# Patient Record
Sex: Male | Born: 1980 | Race: White | Hispanic: No | Marital: Single | Smoking: Former smoker
Health system: Southern US, Community
[De-identification: ages and names within clinical notes are randomized; demographics above are authoritative.]

## PROBLEM LIST (undated history)

## (undated) DIAGNOSIS — G4730 Sleep apnea, unspecified: Secondary | ICD-10-CM

## (undated) DIAGNOSIS — F419 Anxiety disorder, unspecified: Secondary | ICD-10-CM

## (undated) DIAGNOSIS — K219 Gastro-esophageal reflux disease without esophagitis: Secondary | ICD-10-CM

## (undated) DIAGNOSIS — F32A Depression, unspecified: Secondary | ICD-10-CM

## (undated) HISTORY — DX: Sleep apnea, unspecified: G47.30

## (undated) HISTORY — DX: Gastro-esophageal reflux disease without esophagitis: K21.9

## (undated) HISTORY — PX: HERNIA REPAIR: SHX51

## (undated) HISTORY — DX: Anxiety disorder, unspecified: F41.9

## (undated) HISTORY — DX: Depression, unspecified: F32.A

---

## 2015-06-25 DIAGNOSIS — K439 Ventral hernia without obstruction or gangrene: Secondary | ICD-10-CM | POA: Insufficient documentation

## 2021-06-05 ENCOUNTER — Ambulatory Visit: Payer: Self-pay | Admitting: Family Medicine

## 2021-06-25 ENCOUNTER — Encounter: Payer: Self-pay | Admitting: Family Medicine

## 2021-06-25 ENCOUNTER — Ambulatory Visit (INDEPENDENT_AMBULATORY_CARE_PROVIDER_SITE_OTHER): Payer: 59 | Admitting: Family Medicine

## 2021-06-25 VITALS — BP 129/69 | HR 54 | Ht 72.0 in | Wt 225.4 lb

## 2021-06-25 DIAGNOSIS — R109 Unspecified abdominal pain: Secondary | ICD-10-CM | POA: Diagnosis not present

## 2021-06-25 DIAGNOSIS — F32A Depression, unspecified: Secondary | ICD-10-CM

## 2021-06-25 DIAGNOSIS — F419 Anxiety disorder, unspecified: Secondary | ICD-10-CM

## 2021-06-25 DIAGNOSIS — R351 Nocturia: Secondary | ICD-10-CM | POA: Diagnosis not present

## 2021-06-25 LAB — COMPREHENSIVE METABOLIC PANEL
ALT: 17 U/L (ref 0–53)
AST: 16 U/L (ref 0–37)
Albumin: 4.4 g/dL (ref 3.5–5.2)
Alkaline Phosphatase: 105 U/L (ref 39–117)
BUN: 11 mg/dL (ref 6–23)
CO2: 28 mEq/L (ref 19–32)
Calcium: 8.9 mg/dL (ref 8.4–10.5)
Chloride: 102 mEq/L (ref 96–112)
Creatinine, Ser: 0.69 mg/dL (ref 0.40–1.50)
GFR: 115.76 mL/min (ref >60.00)
Glucose, Bld: 106 mg/dL — ABNORMAL HIGH (ref 70–99)
Potassium: 4.2 mEq/L (ref 3.5–5.1)
Sodium: 138 mEq/L (ref 135–145)
Total Bilirubin: 0.4 mg/dL (ref 0.2–1.2)
Total Protein: 7 g/dL (ref 6.0–8.3)

## 2021-06-25 LAB — PSA: PSA: 0.34 ng/mL (ref 0.10–4.00)

## 2021-06-25 LAB — CBC
HCT: 39.5 % (ref 39.0–52.0)
Hemoglobin: 13.2 g/dL (ref 13.0–17.0)
MCHC: 33.5 g/dL (ref 30.0–36.0)
MCV: 89.6 fl (ref 78.0–100.0)
Platelets: 290 10*3/uL (ref 150.0–400.0)
RBC: 4.41 Mil/uL (ref 4.22–5.81)
RDW: 14.3 % (ref 11.5–15.5)
WBC: 5.9 10*3/uL (ref 4.0–10.5)
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
## 2021-06-25 NOTE — Progress Notes (Signed)
Surgical referral for hernia repair ?

## 2021-06-25 NOTE — Patient Instructions (Signed)
Thank you for choosing [facility] at [facility] for your Primary Care needs. I am excited for the opportunity to partner with you to meet your health care goals. It was a pleasure meeting you today!  Recommendations from today's visit: Requesting records from previous provider Referrals placed to behavioral health/psych and urology CT scan to see if these are hernias, if so we can refer to general surgery   Information on diet, exercise, and health maintenance recommendations are listed below. This is information to help you be sure you are on track for optimal health and monitoring.   Please look over this and let us know if you have any questions or if you have completed any of the health maintenance outside of [facility] so that we can be sure your records are up to date.  ___________________________________________________________  MyChart:  For all urgent or time sensitive needs we ask that you please call the office to avoid delays. Our number is [phone]. MyChart is not constantly monitored and due to the large volume of messages a day, replies may take up to 72 business hours.  MyChart Policy: MyChart allows for you to see your visit notes, after visit summary, provider recommendations, lab and tests results, make an appointment, request refills, and contact your provider or the office for non-urgent questions or concerns. Providers are seeing patients during normal business hours and do not have built in time to review MyChart messages.  We ask that you allow a minimum of 3 business days for responses to [employer]. For this reason, please do not send urgent requests through MyChart. Please call the office at [phone]. New and ongoing conditions may require a visit. We have virtual and in-person visits available for your convenience.  Complex MyChart concerns may require a visit. Your provider may request you schedule a virtual or in-person visit  to ensure we are providing the best care possible. MyChart messages sent after 11:00 AM on Friday will not be received by the provider until Monday morning.    Lab and Test Results: You will receive your lab and test results on MyChart as soon as they are completed and results have been sent by the lab or testing facility. Due to this service, you will receive your results BEFORE your provider.  I review lab and test results each morning prior to seeing patients. Some results require collaboration with other providers to ensure you are receiving the most appropriate care. For this reason, we ask that you please allow a minimum of 3-5 business days from the time that ALL results have been received for your provider to receive and review lab and test results and contact you about these.  Most lab and test result comments from the provider will be sent through MyChart. Your provider may recommend changes to the plan of care, follow-up visits, repeat testing, ask questions, or request an office visit to discuss these results. You may reply directly to this message or call the office to provide information for the provider or set up an appointment. In some instances, you will be called with test results and recommendations. Please let us know if this is preferred and we will make note of this in your chart to provide this for you.    If you have not heard a response to your lab or test results in 5 business days from all results returning to MyChart, please call the office to let us know. We ask that  you please avoid calling prior to this time unless there is an emergent concern. Due to high call volumes, this can delay the resulting process.  After Hours: For all non-emergency after hours needs, please call the office at [phone] and select the option to reach the on-call  service. On-call services are shared between multiple [facility] offices and therefore it will not be possible to speak directly with  your provider. On-call providers may provide medical advice and recommendations, but are unable to provide refills for maintenance medications.  For all emergency or urgent medical needs after normal business hours, we recommend that you seek care at the closest Urgent Care or Emergency Department to ensure appropriate treatment in a timely manner.  [facility] [place] has a 24 hour emergency room located on the ground floor for your convenience.   Urgent Concerns During the Business Day Providers are seeing patients from 8AM to 5PM with a busy schedule and are most often not able to respond to non-urgent calls until the end of the day or the next business day. If you should have URGENT concerns during the day, please call and speak to the nurse or schedule a same day appointment so that we can address your concern without delay.   Thank you, again, for choosing me as your health care partner. I appreciate your trust and look forward to learning more about you.   Lollie Marrow Reola Calkins, DNP, FNP-C  ___________________________________________________________  Health Maintenance Recommendations Screening Testing Mammogram Every 1-2 years based on history and risk factors Starting at age 3 Pap Smear Ages 21-39 every 3 years Ages 29-65 every 5 years with HPV testing More frequent testing may be required based on results and history Colon Cancer Screening Every 1-10 years based on test performed, risk factors, and history Starting at age 51 Bone Density Screening Every 2-10 years based on history Starting at age 110 for women Recommendations for men differ based on medication usage, history, and risk factors AAA Screening One time ultrasound Men 49-28 years old who have ever smoked Lung Cancer Screening Low Dose Lung CT every 12 months Age 78-80 years with a 20 pack-year smoking history who still smoke or who have quit within the last 15 years  Screening Labs Routine  Labs:  Complete Blood Count (CBC), Complete Metabolic Panel (CMP), Cholesterol (Lipid Panel) Every 6-12 months based on history and medications May be recommended more frequently based on current conditions or previous results Hemoglobin A1c Lab Every 3-12 months based on history and previous results Starting at age 42 or earlier with diagnosis of diabetes, high cholesterol, BMI >26, and/or risk factors Frequent monitoring for patients with diabetes to ensure blood sugar control Thyroid Panel (TSH w/ T3 & T4) Every 6 months based on history, symptoms, and risk factors May be repeated more often if on medication HIV One time testing for all patients 77 and older May be repeated more frequently for patients with increased risk factors or exposure Hepatitis C One time testing for all patients 28 and older May be repeated more frequently for patients with increased risk factors or exposure Gonorrhea, Chlamydia Every 12 months for all sexually active persons 13-24 years Additional monitoring may be recommended for those who are considered high risk or who have symptoms PSA Men 65-80 years old with risk factors Additional screening may be recommended from age 19-69 based on risk factors, symptoms, and history  Vaccine Recommendations Tetanus Booster All adults every 10 years Flu Vaccine All patients  6 months and older every year COVID Vaccine All patients 12 years and older Initial dosing with booster May recommend additional booster based on age and health history HPV Vaccine 2 doses all patients age 71-26 Dosing may be considered for patients over 26 Shingles Vaccine (Shingrix) 2 doses all adults 50 years and older Pneumonia (Pneumovax 23) All adults 65 years and older May recommend earlier dosing based on health history Pneumonia (Prevnar 30) All adults 65 years and older Dosed 1 year after Pneumovax 23 Pneumonia (Prevnar 20) All adults 65 years and older (adults 19-64 with certain  conditions or risk factors) 1 dose  For those who have no received Prevnar 13 vaccine previously   Additional Screening, Testing, and Vaccinations may be recommended on an individualized basis based on family history, health history, risk factors, and/or exposure.  __________________________________________________________  Diet Recommendations for All Patients  I recommend that all patients maintain a diet low in saturated fats, carbohydrates, and cholesterol. While this can be challenging at first, it is not impossible and small changes can make big differences.  Things to try: Decreasing the amount of soda, sweet tea, and/or juice to one or less per day and replace with water While water is always the first choice, if you do not like water you may consider adding a water additive without sugar to improve the taste other sugar free drinks Replace potatoes with a brightly colored vegetable at dinner Use healthy oils, such as canola oil or olive oil, instead of butter or hard margarine Limit your bread intake to two pieces or less a day Replace regular pasta with low carb pasta options Bake, broil, or grill foods instead of frying Monitor portion sizes  Eat smaller, more frequent meals throughout the day instead of large meals  An important thing to remember is, if you love foods that are not great for your health, you don't have to give them up completely. Instead, allow these foods to be a reward when you have done well. Allowing yourself to still have special treats every once in a while is a nice way to tell yourself thank you for working hard to keep yourself healthy.   Also remember that every day is a new day. If you have a bad day and "fall off the wagon", you can still climb right back up and keep moving along on your journey!  We have resources available to help you!  Some websites that may be helpful  include: www.http://www.wall-moore.info/  Www.VeryWellFit.com _____________________________________________________________  Activity Recommendations for All Patients  I recommend that all adults get at least 20 minutes of moderate physical activity that elevates your heart rate at least 5 days out of the week.  Some examples include: Walking or jogging at a pace that allows you to carry on a conversation Cycling (stationary bike or outdoors) Water aerobics Yoga Weight lifting Dancing If physical limitations prevent you from putting stress on your joints, exercise in a pool or seated in a chair are excellent options.  Do determine your MAXIMUM heart rate for activity: YOUR AGE - 220 = MAX HeartRate   Remember! Do not push yourself too hard.  Start slowly and build up your pace, speed, weight, time in exercise, etc.  Allow your body to rest between exercise and get good sleep. You will need more water than normal when you are exerting yourself. Do not wait until you are thirsty to drink. Drink with a purpose of getting in at least 8, 8 ounce glasses  of water a day plus more depending on how much you exercise and sweat.    If you begin to develop dizziness, chest pain, abdominal pain, jaw pain, shortness of breath, headache, vision changes, lightheadedness, or other concerning symptoms, stop the activity and allow your body to rest. If your symptoms are severe, seek emergency evaluation immediately. If your symptoms are concerning, but not severe, please let us know so that we can recommend further evaluation.

## 2021-07-04 ENCOUNTER — Telehealth (HOSPITAL_BASED_OUTPATIENT_CLINIC_OR_DEPARTMENT_OTHER): Payer: Self-pay

## 2021-07-12 ENCOUNTER — Telehealth (HOSPITAL_BASED_OUTPATIENT_CLINIC_OR_DEPARTMENT_OTHER): Payer: Self-pay

## 2021-07-18 ENCOUNTER — Ambulatory Visit: Payer: 59 | Admitting: Urology

## 2021-08-06 ENCOUNTER — Encounter: Payer: Self-pay | Admitting: Urology

## 2021-08-06 ENCOUNTER — Ambulatory Visit: Payer: 59 | Admitting: Urology

## 2021-08-30 ENCOUNTER — Ambulatory Visit: Payer: 59 | Admitting: Urology

## 2021-09-02 ENCOUNTER — Inpatient Hospital Stay (HOSPITAL_BASED_OUTPATIENT_CLINIC_OR_DEPARTMENT_OTHER)
Admission: EM | Admit: 2021-09-02 | Discharge: 2021-09-07 | DRG: 060 | Disposition: A | Discharge status: Home / Self Care | Payer: 59 | Attending: Family Medicine | Admitting: Family Medicine

## 2021-09-02 ENCOUNTER — Emergency Department (HOSPITAL_BASED_OUTPATIENT_CLINIC_OR_DEPARTMENT_OTHER): Payer: 59

## 2021-09-02 ENCOUNTER — Encounter (HOSPITAL_BASED_OUTPATIENT_CLINIC_OR_DEPARTMENT_OTHER): Payer: Self-pay | Admitting: Emergency Medicine

## 2021-09-02 ENCOUNTER — Emergency Department (HOSPITAL_COMMUNITY): Payer: 59

## 2021-09-02 ENCOUNTER — Other Ambulatory Visit: Payer: Self-pay

## 2021-09-02 DIAGNOSIS — R202 Paresthesia of skin: Secondary | ICD-10-CM

## 2021-09-02 DIAGNOSIS — F909 Attention-deficit hyperactivity disorder, unspecified type: Secondary | ICD-10-CM | POA: Diagnosis present

## 2021-09-02 DIAGNOSIS — K5909 Other constipation: Secondary | ICD-10-CM | POA: Diagnosis present

## 2021-09-02 DIAGNOSIS — G379 Demyelinating disease of central nervous system, unspecified: Secondary | ICD-10-CM | POA: Diagnosis not present

## 2021-09-02 DIAGNOSIS — F32A Depression, unspecified: Secondary | ICD-10-CM | POA: Diagnosis present

## 2021-09-02 DIAGNOSIS — Z79899 Other long term (current) drug therapy: Secondary | ICD-10-CM

## 2021-09-02 DIAGNOSIS — R946 Abnormal results of thyroid function studies: Secondary | ICD-10-CM | POA: Diagnosis present

## 2021-09-02 DIAGNOSIS — K219 Gastro-esophageal reflux disease without esophagitis: Secondary | ICD-10-CM | POA: Diagnosis present

## 2021-09-02 DIAGNOSIS — F410 Panic disorder [episodic paroxysmal anxiety] without agoraphobia: Secondary | ICD-10-CM | POA: Diagnosis present

## 2021-09-02 DIAGNOSIS — Z818 Family history of other mental and behavioral disorders: Secondary | ICD-10-CM

## 2021-09-02 DIAGNOSIS — R200 Anesthesia of skin: Secondary | ICD-10-CM

## 2021-09-02 DIAGNOSIS — F419 Anxiety disorder, unspecified: Secondary | ICD-10-CM | POA: Diagnosis present

## 2021-09-02 DIAGNOSIS — R350 Frequency of micturition: Secondary | ICD-10-CM | POA: Diagnosis present

## 2021-09-02 DIAGNOSIS — G35 Multiple sclerosis: Secondary | ICD-10-CM | POA: Diagnosis not present

## 2021-09-02 DIAGNOSIS — Z87891 Personal history of nicotine dependence: Secondary | ICD-10-CM

## 2021-09-02 DIAGNOSIS — G4733 Obstructive sleep apnea (adult) (pediatric): Secondary | ICD-10-CM | POA: Diagnosis present

## 2021-09-02 DIAGNOSIS — R7989 Other specified abnormal findings of blood chemistry: Secondary | ICD-10-CM

## 2021-09-02 LAB — BASIC METABOLIC PANEL
Anion gap: 6 (ref 5–15)
BUN: 12 mg/dL (ref 6–20)
CO2: 24 mmol/L (ref 22–32)
Calcium: 8.9 mg/dL (ref 8.9–10.3)
Chloride: 107 mmol/L (ref 98–111)
Creatinine, Ser: 0.76 mg/dL (ref 0.61–1.24)
GFR, Estimated: >60 mL/min (ref >60)
Glucose, Bld: 120 mg/dL — ABNORMAL HIGH (ref 70–99)
Potassium: 3.5 mmol/L (ref 3.5–5.1)
Sodium: 137 mmol/L (ref 135–145)

## 2021-09-02 LAB — CBC
HCT: 42.4 % (ref 39.0–52.0)
Hemoglobin: 14.5 g/dL (ref 13.0–17.0)
MCH: 30.2 pg (ref 26.0–34.0)
MCHC: 34.2 g/dL (ref 30.0–36.0)
MCV: 88.3 fL (ref 80.0–100.0)
Platelets: 293 10*3/uL (ref 150–400)
RBC: 4.8 MIL/uL (ref 4.22–5.81)
RDW: 13.2 % (ref 11.5–15.5)
WBC: 7.3 10*3/uL (ref 4.0–10.5)
nRBC: 0 % (ref 0.0–0.2)

## 2021-09-02 LAB — CBG MONITORING, ED: Glucose-Capillary: 169 mg/dL — ABNORMAL HIGH (ref 70–99)

## 2021-09-02 IMAGING — MR MR HEAD W/O CM
10 of 11 series · 43 of 48 positions shown · non-contrast
Comparison: CT from earlier the same day.

CLINICAL DATA: Initial evaluation for neuro deficit, stroke
suspected.

EXAM:
MRI HEAD WITHOUT CONTRAST
TECHNIQUE: Multiplanar, multiecho pulse sequences of the brain and surrounding
structures were obtained without intravenous contrast.

[Series 5: DWI · axial · 3.0mm · 0.88mm/px · z∈[-111,+36]mm · 10 of 100 slices shown (1 of 4)]
[im 1/100]
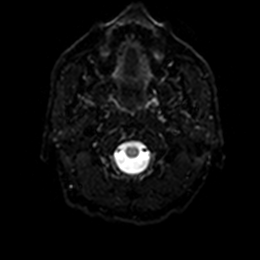
[im 12/100]
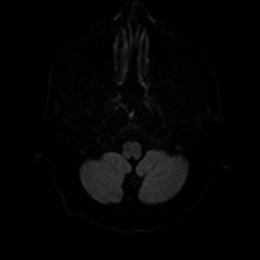
[im 23/100]
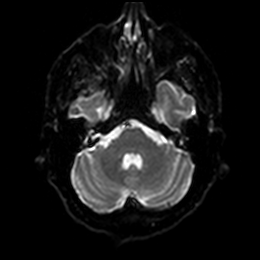
[im 34/100]
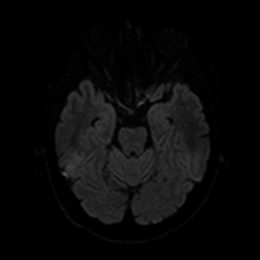
[im 45/100]
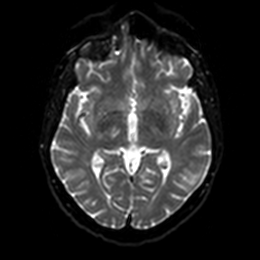
[im 56/100]
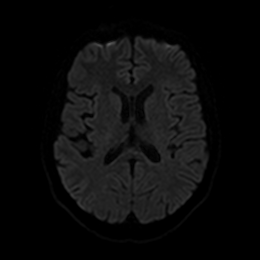
[im 67/100]
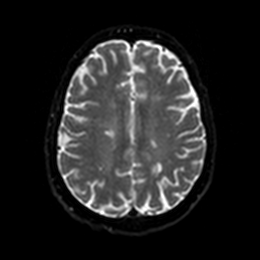
[im 78/100]
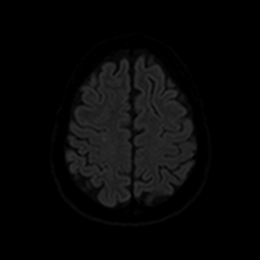
[im 89/100]
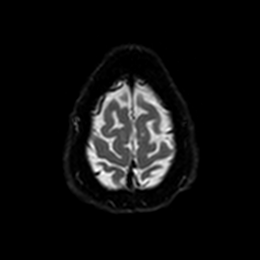
[im 100/100]
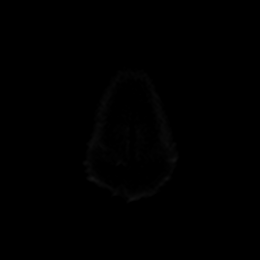

[Series 6: DWI · axial · 3.0mm · 0.88mm/px · z∈[-111,+36]mm · 5 of 49 slices shown (2 of 4)]
[im 1/49]
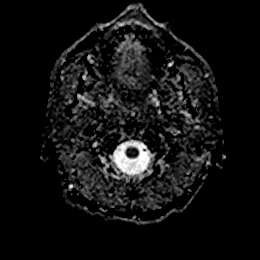
[im 13/49]
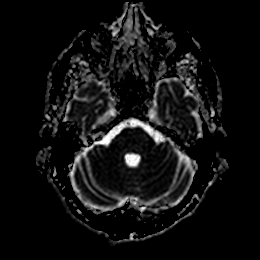
[im 25/49]
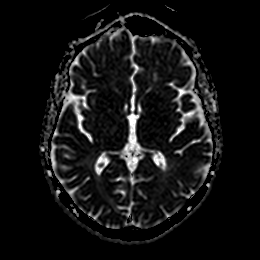
[im 37/49]
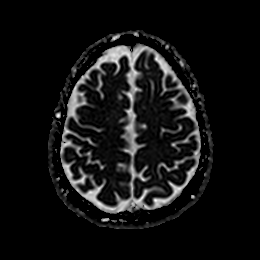
[im 49/49]
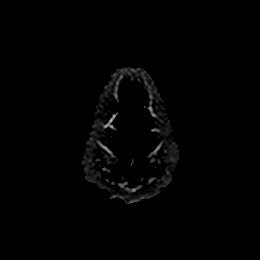

[Series 7: DWI · coronal · 4.0mm · 0.88mm/px · 6 of 64 slices shown (3 of 4)]
[im 1/64]
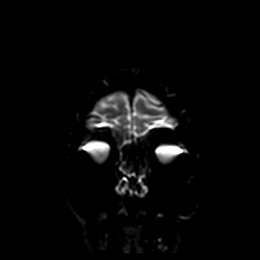
[im 13/64]
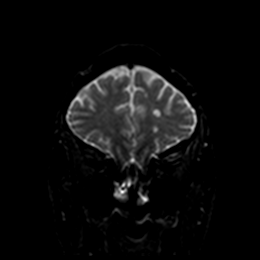
[im 26/64]
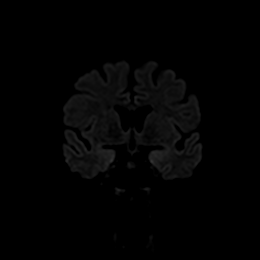
[im 38/64]
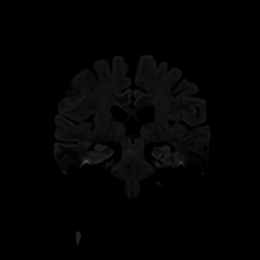
[im 51/64]
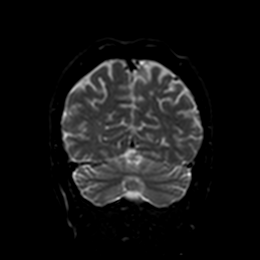
[im 64/64]
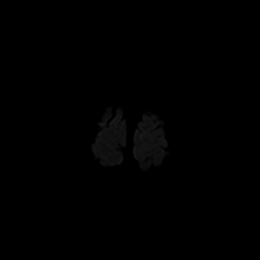

[Series 8: DWI · coronal · 4.0mm · 0.88mm/px · 3 of 32 slices shown (4 of 4)]
[im 1/32]
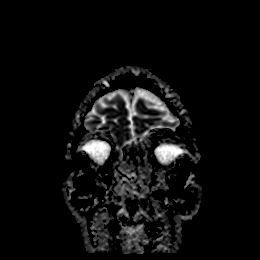
[im 16/32]
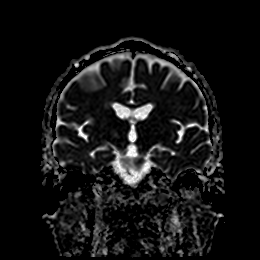
[im 32/32]
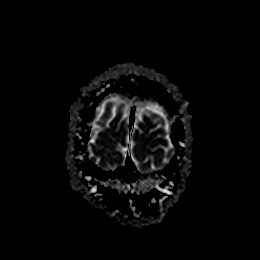

[Series 9: T1 · sagittal · 5.0mm · 0.75mm/px · 2 of 23 slices shown]
[im 1/23]
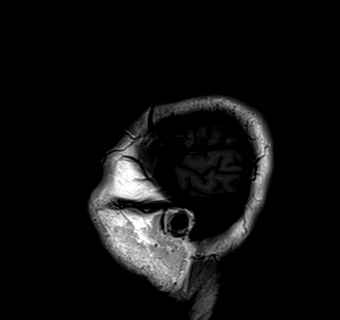
[im 23/23]
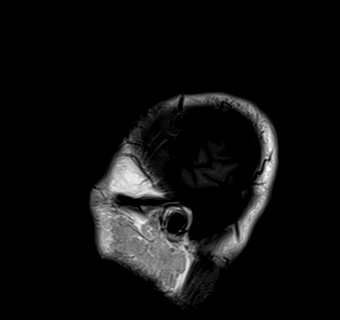

[Series 10: T2 · axial · 5.0mm · 0.72mm/px · z∈[-115,+40]mm · 2 of 27 slices shown (1 of 2)]
[im 1/27]
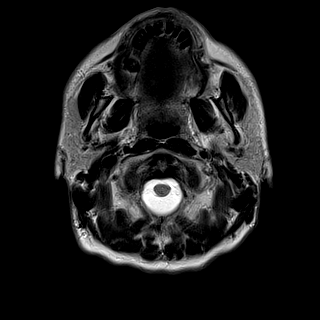
[im 27/27]
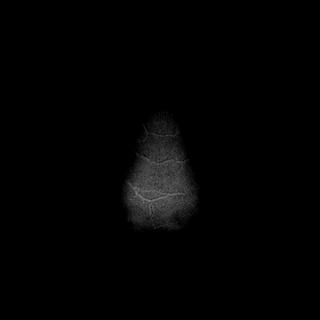

[Series 11: FLAIR · axial · 5.0mm · 0.45mm/px · z∈[-116,+40]mm · 2 of 27 slices shown]
[im 1/27]
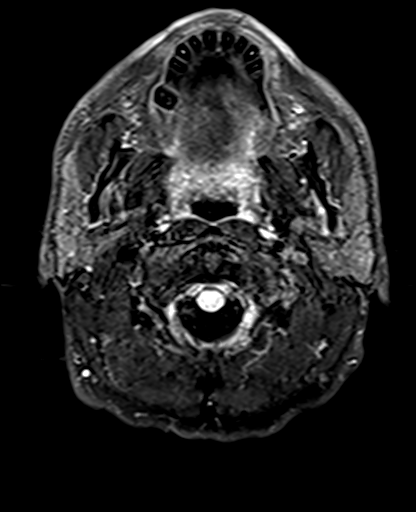
[im 27/27]
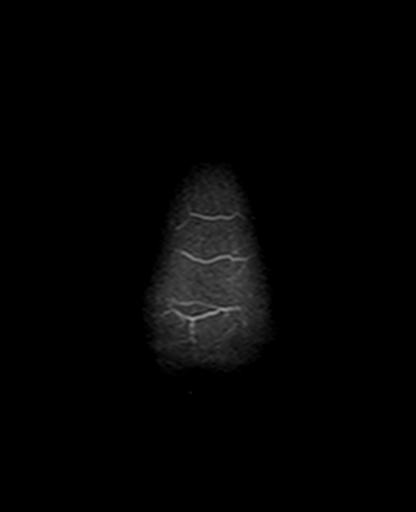

[Series 13: pha_images · axial · 3.0mm · 0.90mm/px · z∈[-123,+50]mm · 5 of 59 slices shown]
[im 1/59]
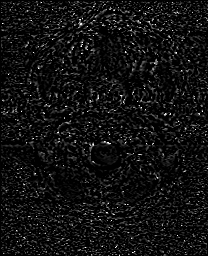
[im 15/59]
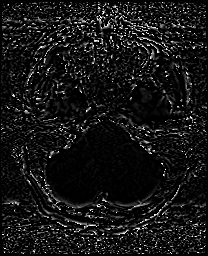
[im 30/59]
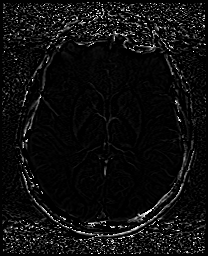
[im 44/59]
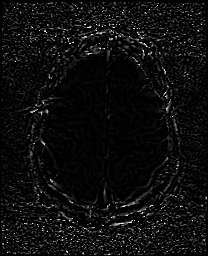
[im 59/59]
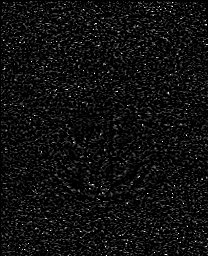

[Series 14: swi_images · axial · 3.0mm · 0.90mm/px · z∈[-126,+50]mm · 5 of 60 slices shown]
[im 1/60]
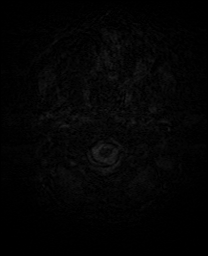
[im 15/60]
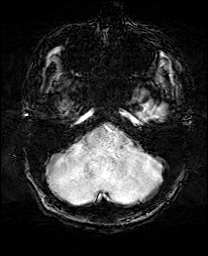
[im 30/60]
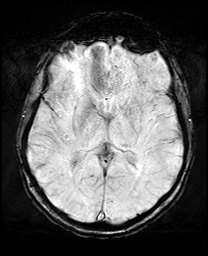
[im 45/60]
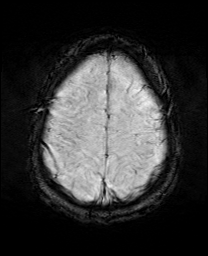
[im 60/60]
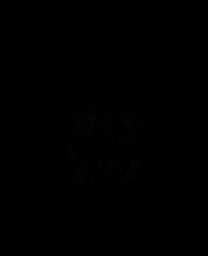

[Series 17: T2 · coronal · 5.0mm · 0.34mm/px · 3 of 29 slices shown (2 of 2)]
[im 1/29]
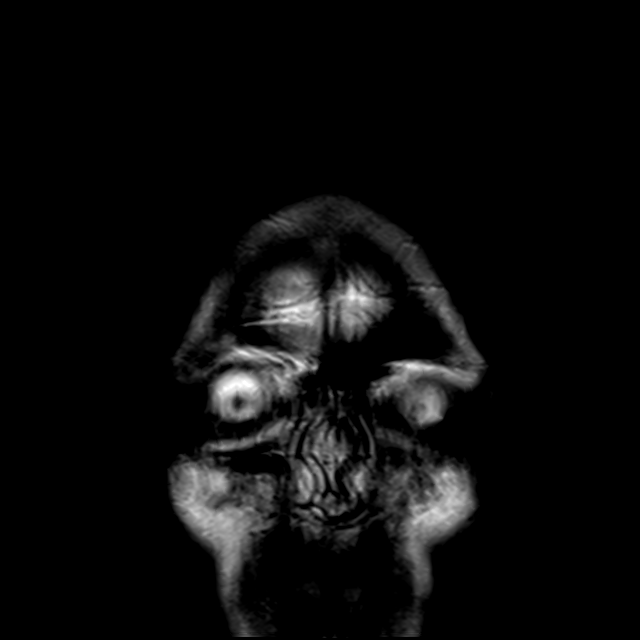
[im 15/29]
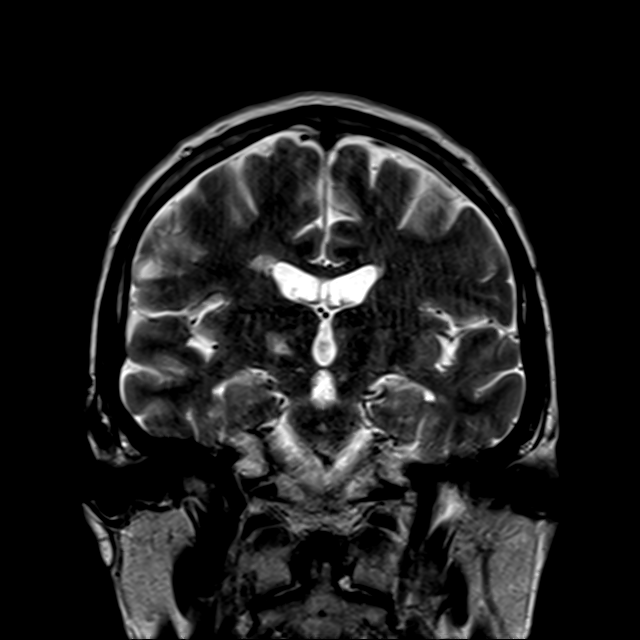
[im 29/29]
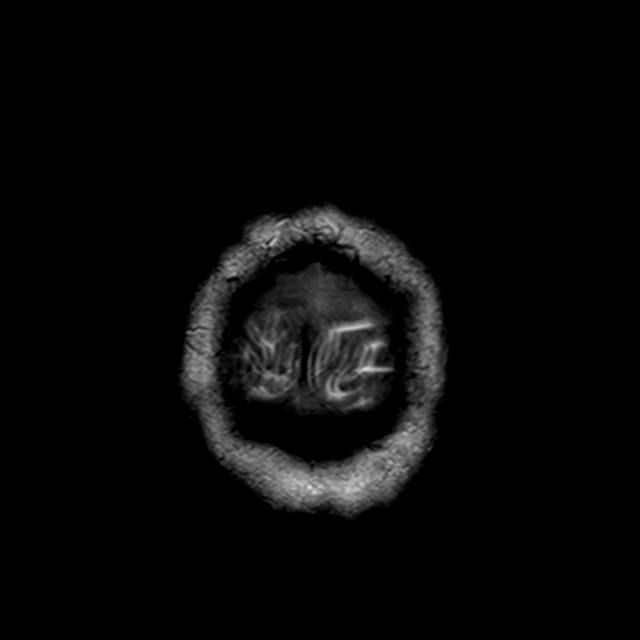

[43 of 48 positions shown; findings below may reference images not displayed]

FINDINGS: Brain: Cerebral volume within normal limits. Scattered multifocal
foci of T2/FLAIR signal abnormality seen involving the
periventricular, deep, and juxta cortical white matter of both
cerebral hemispheres. Several of these foci oriented perpendicular
to the lateral ventricles. Involvement of the right thalamic
capsular region. Multiple corresponding T1 black holes. Appearance
is most concerning for demyelinating disease. No associated
diffusion abnormality to suggest active demyelination on this
noncontrast examination.

No evidence for acute or subacute infarct. Gray-white matter
differentiation otherwise maintained. No acute or chronic
intracranial blood products.

No mass lesion or midline shift. No hydrocephalus or extra-axial
fluid collection. Pituitary gland suprasellar region normal.

Vascular: Major intracranial vascular flow voids are maintained.

Skull and upper cervical spine: Craniocervical junction normal. Bone
marrow signal intensity within normal limits. No scalp soft tissue
abnormality.

Sinuses/Orbits: Globes and orbital soft tissues within normal
limits. Paranasal sinuses are clear. No mastoid effusion.

Other: None.
IMPRESSION: 1. Scattered multifocal T2/FLAIR signal abnormality involving the
supratentorial cerebral white matter, most concerning for
demyelinating disease. No associated diffusion abnormality to
suggest active demyelination on this noncontrast examination.
2. No other acute intracranial abnormality.

## 2021-09-02 IMAGING — CT CT CERVICAL SPINE W/O CM
3 of 4 series · 13 of 33 positions shown, 16 images · non-contrast
Comparison: None Available.

CLINICAL DATA: Altered mental status, numbness



[Series 3: c_spine 2.0 i30s 3 · axial · 0.39mm/px · z∈[+788,+908]mm · 5 of 92 slices shown, 7 images]
[im 16/92  soft-tissue]
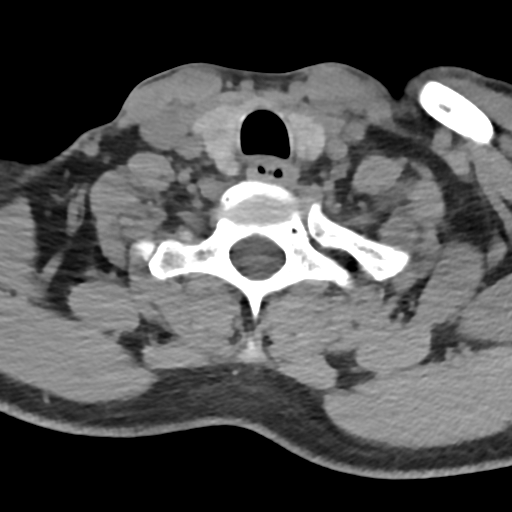
[im 16/92  bone]
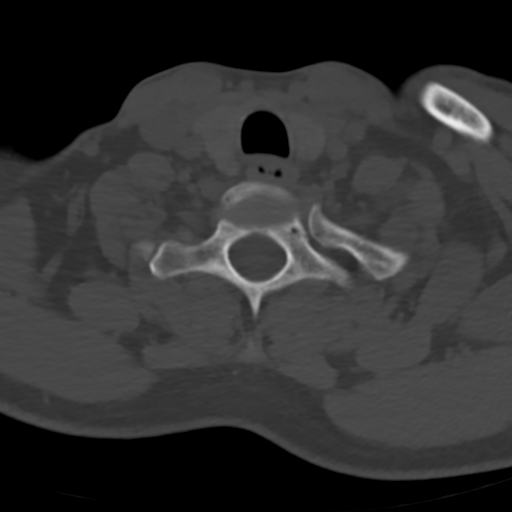
[im 31/92  bone]
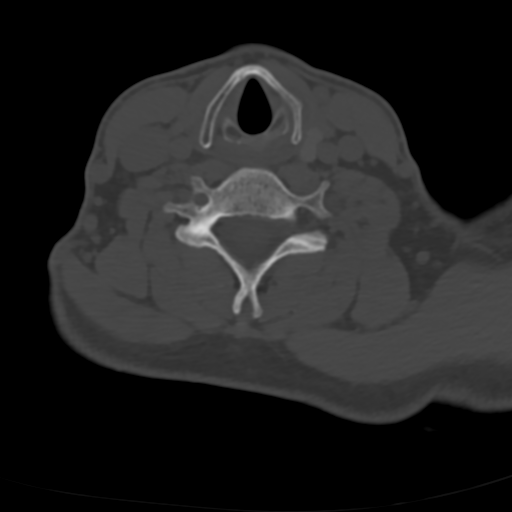
[im 46/92  bone]
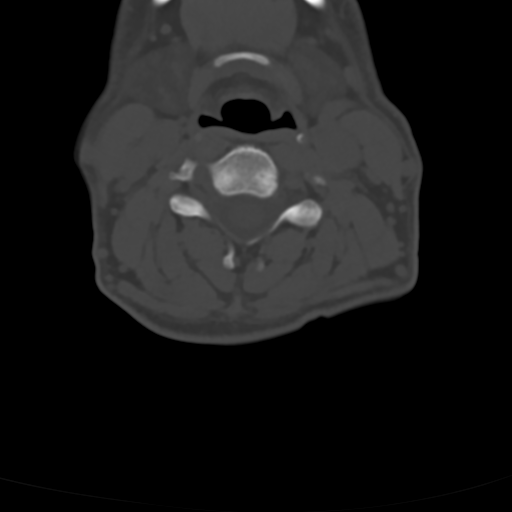
[im 61/92  bone]
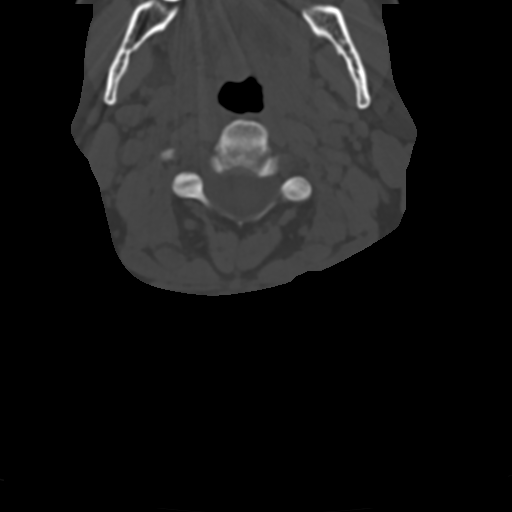
[im 76/92  soft-tissue]
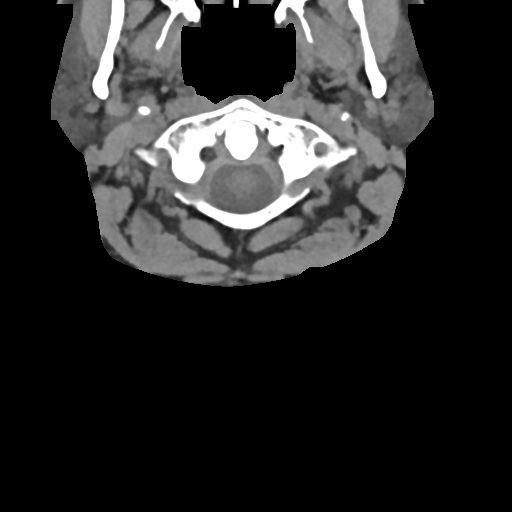
[im 76/92  bone]
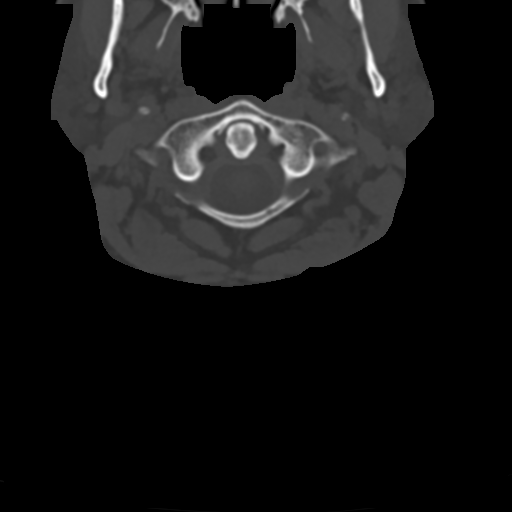

[Series 5: coronals · coronal · 0.27mm/px · 3 of 56 slices shown]
[im 12/56  bone]
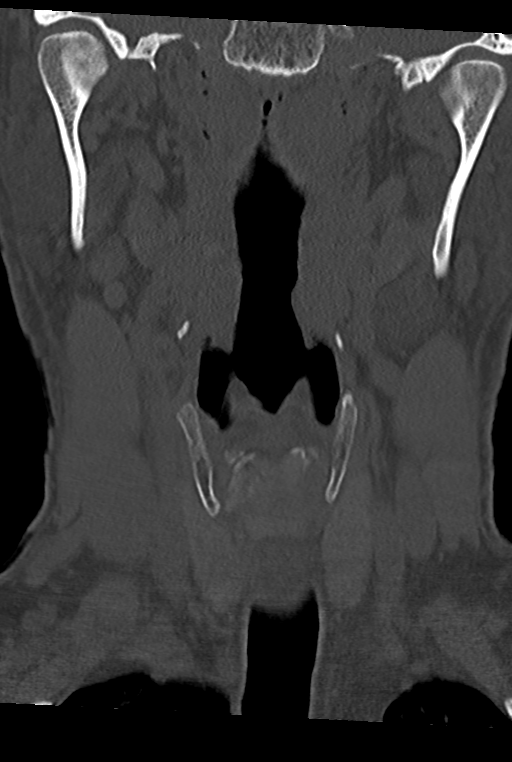
[im 23/56  bone]
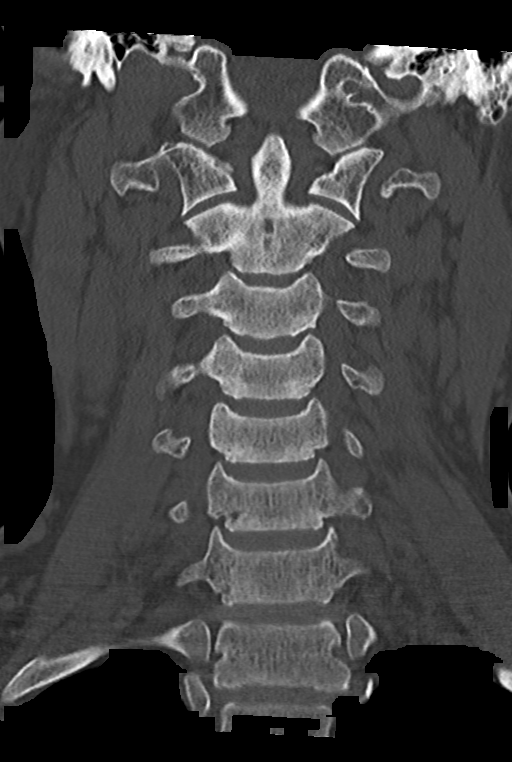
[im 34/56  bone]
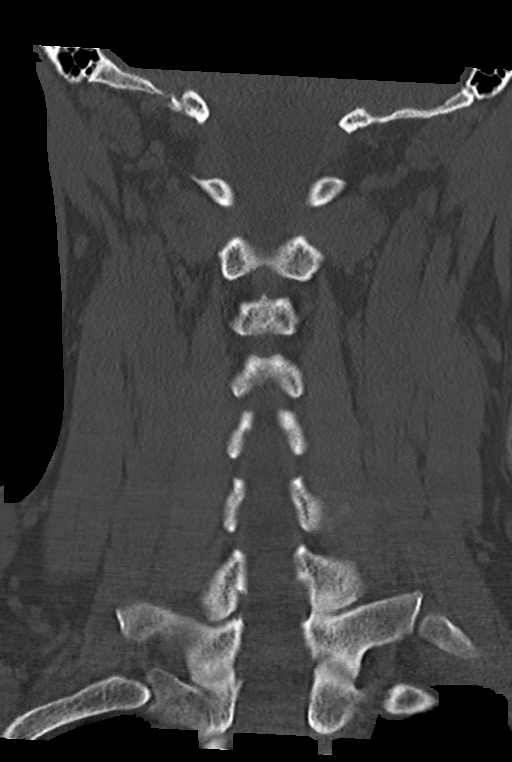

[Series 6: sagittals · sagittal · 0.29mm/px · 5 of 75 slices shown, 6 images]
[im 25/75  bone]
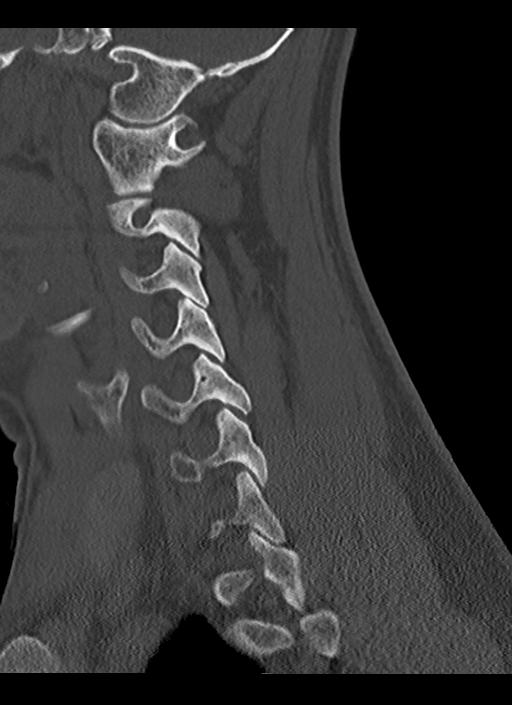
[im 31/75  bone]
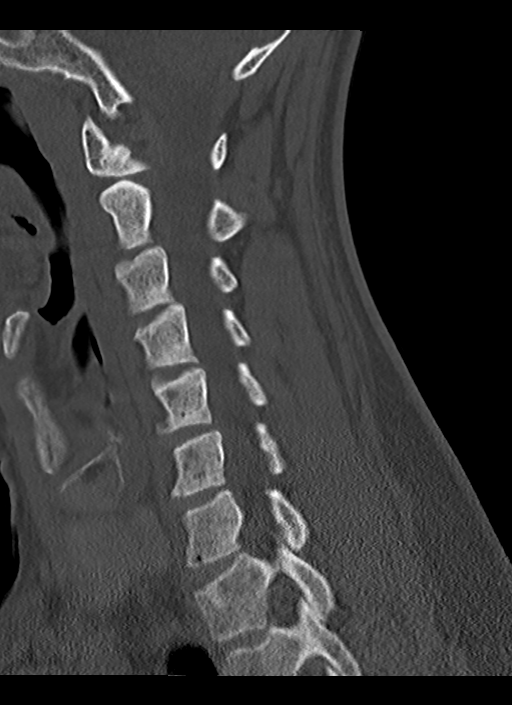
[im 38/75  soft-tissue]
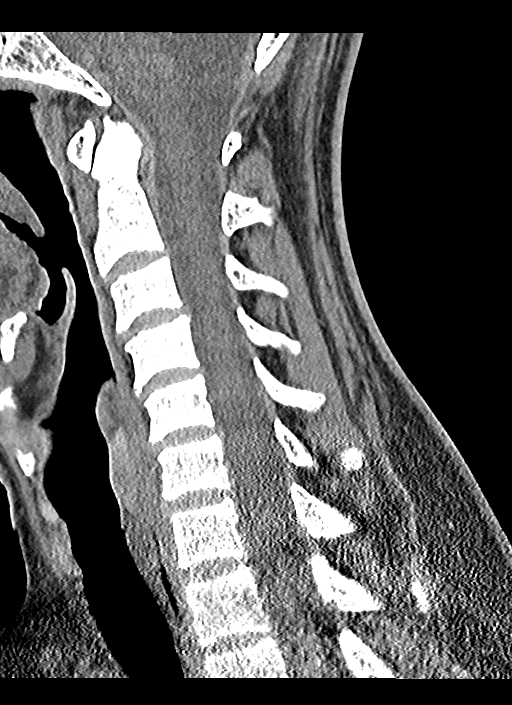
[im 38/75  bone]
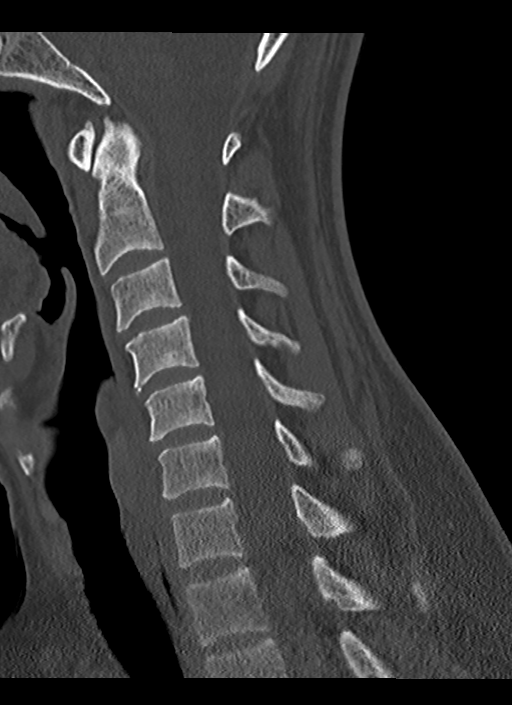
[im 44/75  bone]
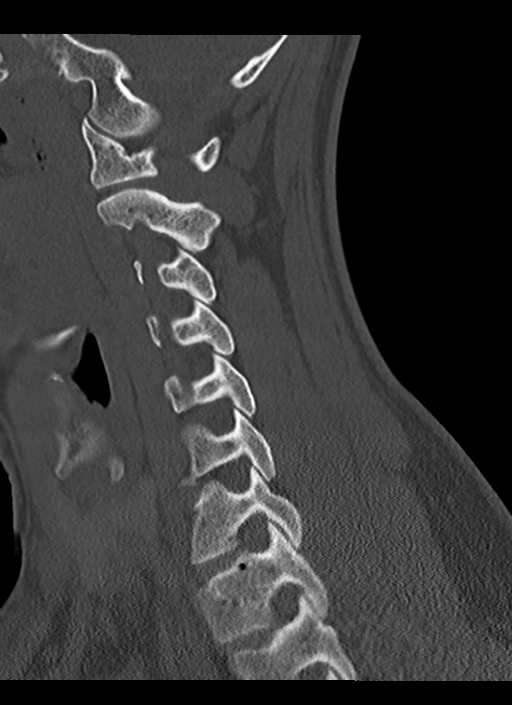
[im 50/75  bone]
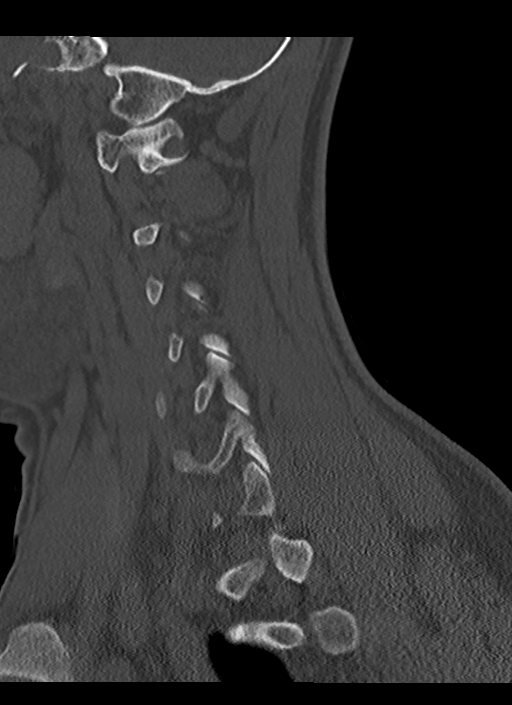

[13 of 33 positions shown; findings below may reference images not displayed]

FINDINGS: Alignment: Normal.

Skull base and vertebrae: No acute fracture. No primary bone lesion
or focal pathologic process.

Soft tissues and spinal canal: No prevertebral fluid or swelling. No
visible canal hematoma.

Disc levels:  Intervertebral disc spaces are preserved.

Upper chest: No acute process.

Other: None.
IMPRESSION: No acute fracture or malalignment identified in the cervical spine.

## 2021-09-02 IMAGING — CT CT ANGIO HEAD-NECK (W OR W/O PERF)
3 of 7 series · 10 of 36 positions shown · IV contrast (APPLIED)
Comparison: Head CT from earlier the same day.

CLINICAL DATA: Initial evaluation for focal neural deficit,
paresthesias.

EXAM:
CT ANGIOGRAPHY HEAD AND NECK
TECHNIQUE: Multidetector CT imaging of the head and neck was performed using
the standard protocol during bolus administration of intravenous
contrast. Multiplanar CT image reconstructions and MIPs were
obtained to evaluate the vascular anatomy. Carotid stenosis
measurements (when applicable) are obtained utilizing NASCET
criteria, using the distal internal carotid diameter as the
denominator.

[Series 5: cta neck/head · axial · 0.62mm/px · z∈[-717,-593]mm · 2 of 186 slices shown]
[im 62/186  soft-tissue]
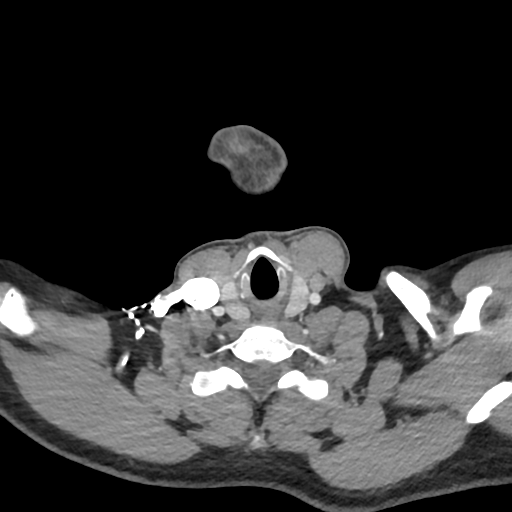
[im 124/186  soft-tissue]
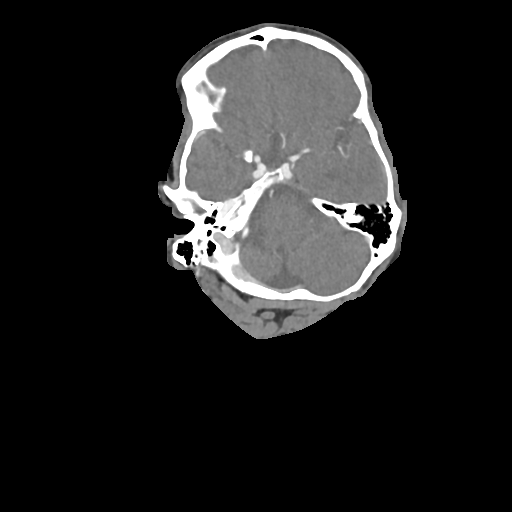

[Series 7: ax thins · axial · 0.54mm/px · z∈[-830,-567]mm · 6 of 405 slices shown]
[im 58/405  soft-tissue]
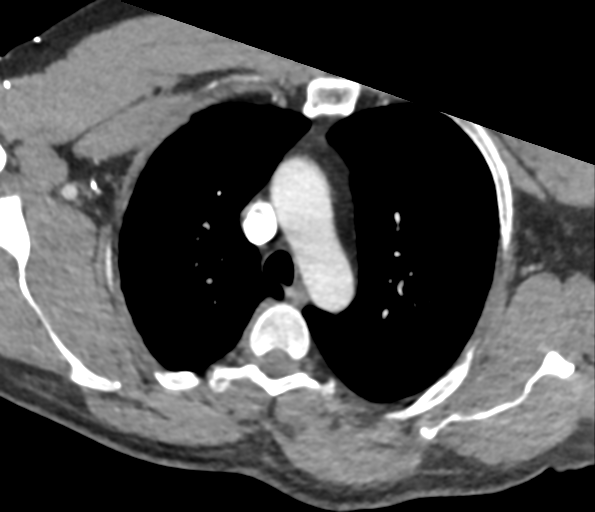
[im 116/405  bone]
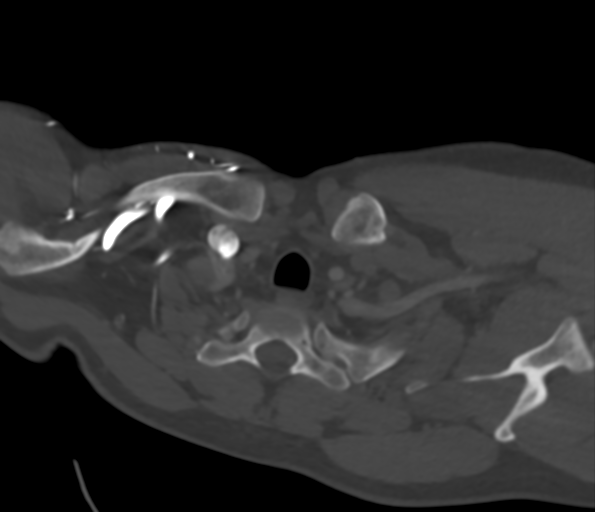
[im 174/405  soft-tissue]
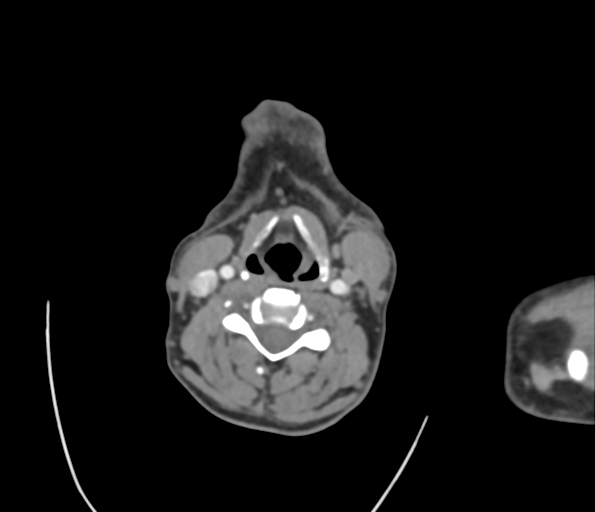
[im 231/405  bone]
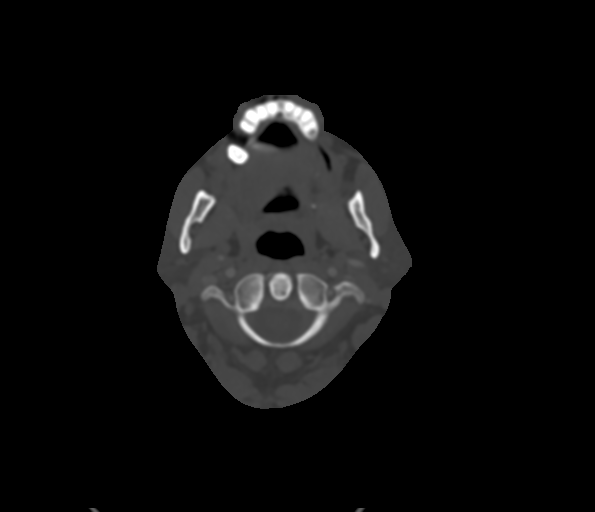
[im 289/405  soft-tissue]
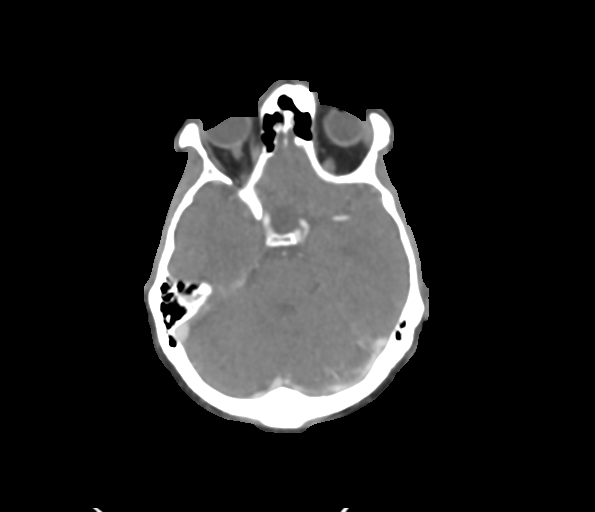
[im 347/405  bone]
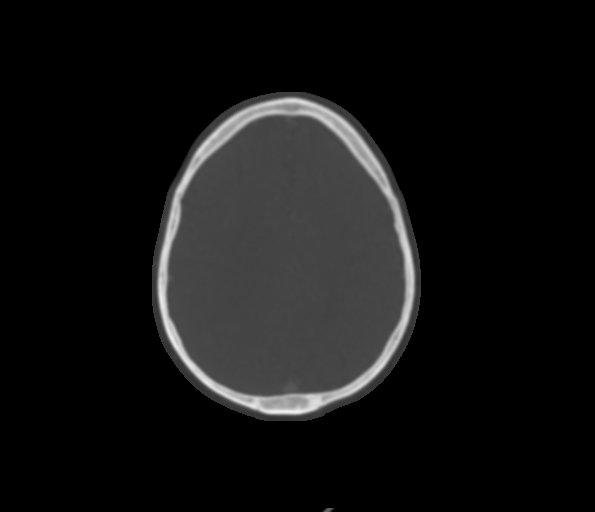

[Series 9: sag thins · sagittal · 0.57mm/px · 2 of 177 slices shown]
[im 47/177  soft-tissue]
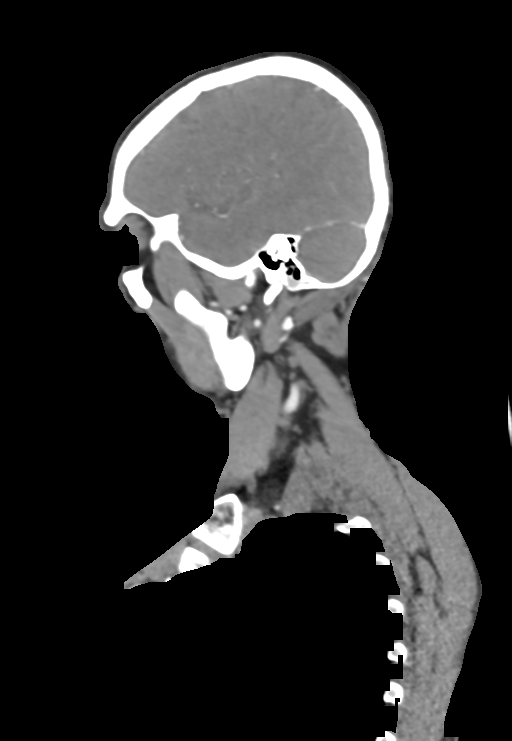
[im 131/177  soft-tissue]
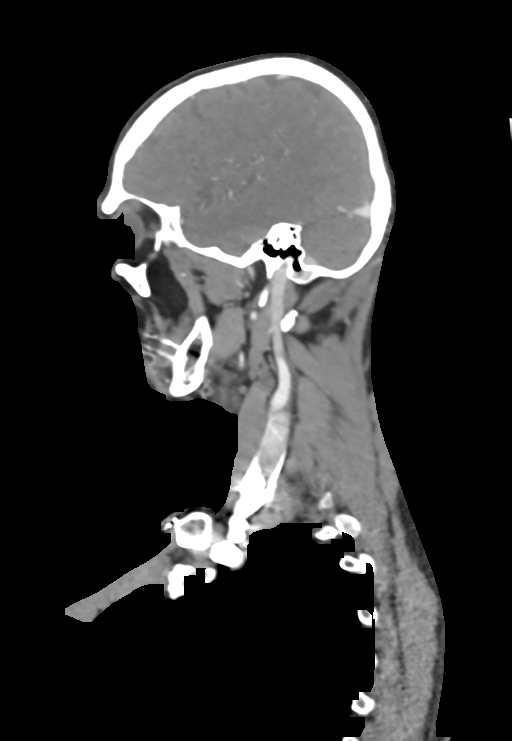

[10 of 36 positions shown; findings below may reference images not displayed]

RADIATION DOSE REDUCTION: This exam was performed according to the
departmental dose-optimization program which includes automated
exposure control, adjustment of the mA and/or kV according to
patient size and/or use of iterative reconstruction technique.

CONTRAST:  75mL OMNIPAQUE IOHEXOL 350 MG/ML SOLN
FINDINGS: CTA NECK FINDINGS

Aortic arch: Visualized aortic arch normal caliber with normal
branch pattern. No stenosis about the origin of the great vessels.

Right carotid system: Right common and internal carotid arteries
widely patent without stenosis, dissection or occlusion.

Left carotid system: Left common and internal carotid arteries
widely patent without stenosis, dissection or occlusion.

Vertebral arteries: Both vertebral arteries arise from the
subclavian arteries. No proximal subclavian artery stenosis. Both
vertebral arteries widely patent without stenosis, dissection or
occlusion.

Skeleton: No discrete or worrisome osseous lesions.

Other neck: No other acute soft tissue abnormality within the neck.

Upper chest: Visualized upper chest demonstrates no acute finding.

Review of the MIP images confirms the above findings

CTA HEAD FINDINGS

Anterior circulation: Both internal carotid arteries widely patent
to the termini without stenosis. A1 segments widely patent. Normal
anterior communicating artery complex. Both anterior cerebral
arteries widely patent to their distal aspects without stenosis. No
M1 stenosis or occlusion. Normal MCA bifurcations. Distal MCA
branches well perfused and symmetric.

Posterior circulation: Vertebral arteries are diminutive but patent
without stenosis. Both PICA patent. Diminutive basilar artery widely
patent to its distal aspect. Superior cerebellar arteries patent
bilaterally. Fetal type origin of the PCAs. Both PCAs well perfused
or distal aspects.

Venous sinuses: Patent noted

Anatomic variants: Fetal type origin of the PCAs with overall
diminutive vertebrobasilar system.

Review of the MIP images confirms the above findings
IMPRESSION: 1. Negative CTA of the head and neck. No large vessel occlusion,
hemodynamically significant stenosis, or other acute vascular
abnormality.
2. Fetal type origin of the PCAs with overall diminutive
vertebrobasilar system.

## 2021-09-02 MED ORDER — IOHEXOL 350 MG/ML SOLN
75.0000 mL | Freq: Once | INTRAVENOUS | Status: AC | PRN
  Administered 2021-09-02: 75 mL via INTRAVENOUS

## 2021-09-02 NOTE — ED Notes (Signed)
Patient to MRI.

## 2021-09-02 NOTE — ED Provider Notes (Signed)
Patient transferred from [facility] for MRI and CT scan for further delineation.  Updated patient on delay and then updated patient on results.  Discussed demyelination findings.  Awaiting neurology final recommendations.  Patient has had bilateral hand numbness starting yesterday evening and symptoms worsen anxiety symptoms in the past. ? ?Patient care signed out to follow-up neurology recommendations. ? ?Enid Skeens ? ?  ?Blane Ohara, MD ?09/02/21 2358 ? ?
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
## 2021-09-02 NOTE — ED Triage Notes (Signed)
Pt arrives pov, drove self to ED, to triage in wheelchair, c/o hallucinations, reports "teeth are too big for the people on TV." Also c/o bilateral hand numbness starting at 1700 last night. Pt bilaterally equal, Van negative.AOx4 ?
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
## 2021-09-03 ENCOUNTER — Emergency Department (HOSPITAL_COMMUNITY): Payer: 59

## 2021-09-03 DIAGNOSIS — F32A Depression, unspecified: Secondary | ICD-10-CM | POA: Diagnosis present

## 2021-09-03 DIAGNOSIS — R350 Frequency of micturition: Secondary | ICD-10-CM

## 2021-09-03 DIAGNOSIS — R7989 Other specified abnormal findings of blood chemistry: Secondary | ICD-10-CM | POA: Diagnosis not present

## 2021-09-03 DIAGNOSIS — F909 Attention-deficit hyperactivity disorder, unspecified type: Secondary | ICD-10-CM | POA: Diagnosis present

## 2021-09-03 DIAGNOSIS — Z79899 Other long term (current) drug therapy: Secondary | ICD-10-CM | POA: Diagnosis not present

## 2021-09-03 DIAGNOSIS — K219 Gastro-esophageal reflux disease without esophagitis: Secondary | ICD-10-CM | POA: Diagnosis present

## 2021-09-03 DIAGNOSIS — G379 Demyelinating disease of central nervous system, unspecified: Secondary | ICD-10-CM | POA: Diagnosis present

## 2021-09-03 DIAGNOSIS — F410 Panic disorder [episodic paroxysmal anxiety] without agoraphobia: Secondary | ICD-10-CM | POA: Diagnosis present

## 2021-09-03 DIAGNOSIS — F419 Anxiety disorder, unspecified: Secondary | ICD-10-CM | POA: Diagnosis present

## 2021-09-03 DIAGNOSIS — R946 Abnormal results of thyroid function studies: Secondary | ICD-10-CM | POA: Diagnosis present

## 2021-09-03 DIAGNOSIS — K5909 Other constipation: Secondary | ICD-10-CM | POA: Diagnosis present

## 2021-09-03 DIAGNOSIS — G35 Multiple sclerosis: Secondary | ICD-10-CM | POA: Diagnosis present

## 2021-09-03 DIAGNOSIS — Z818 Family history of other mental and behavioral disorders: Secondary | ICD-10-CM | POA: Diagnosis not present

## 2021-09-03 DIAGNOSIS — Z87891 Personal history of nicotine dependence: Secondary | ICD-10-CM | POA: Diagnosis not present

## 2021-09-03 DIAGNOSIS — G4733 Obstructive sleep apnea (adult) (pediatric): Secondary | ICD-10-CM | POA: Diagnosis present

## 2021-09-03 LAB — HIV ANTIBODY (ROUTINE TESTING W REFLEX): HIV Screen 4th Generation wRfx: NONREACTIVE

## 2021-09-03 LAB — RPR: RPR Ser Ql: NONREACTIVE

## 2021-09-03 LAB — VITAMIN B12: Vitamin B-12: 310 pg/mL (ref 180–914)

## 2021-09-03 IMAGING — MR MR HEAD W/ CM
7 of 9 series · 21 of 48 positions shown · IV contrast (gadavist)
Comparison: Noncontrast brain MRI from yesterday

CLINICAL DATA: Demyelination on brain MRI

EXAM:
MRI HEAD WITH CONTRAST
TECHNIQUE: Multiplanar, multiecho pulse sequences of the brain and surrounding
structures were obtained with intravenous contrast.
CONTRAST:  10mL GADAVIST GADOBUTROL 1 MMOL/ML IV SOLN

[Series 5: t2_space_dark-fluid_sag_p2_ns-ir · sagittal · 1.0mm · 0.49mm/px · 8 of 192 slices shown]
[im 1/192]
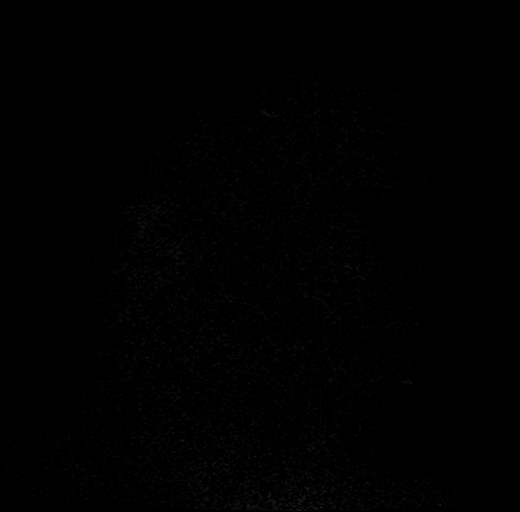
[im 22/192]
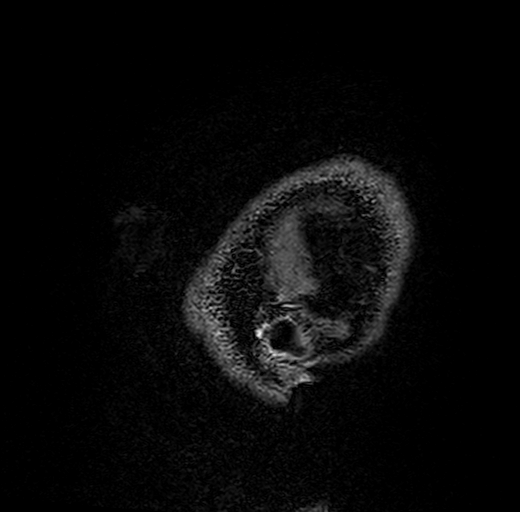
[im 64/192]
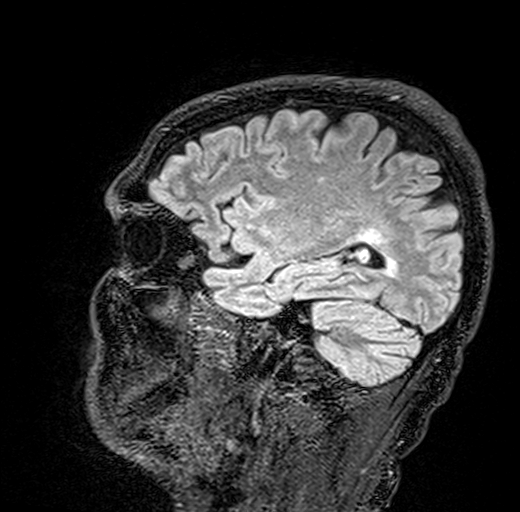
[im 85/192]
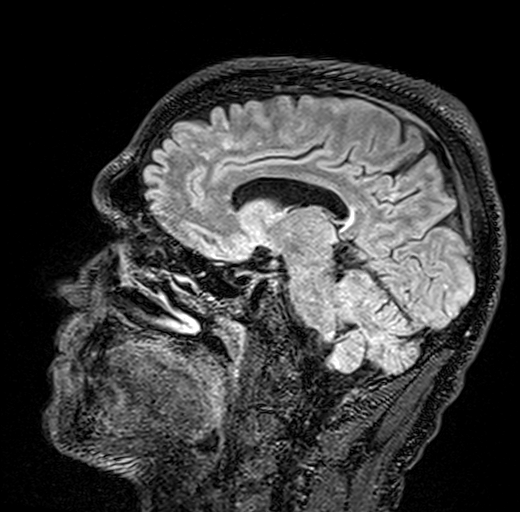
[im 107/192]
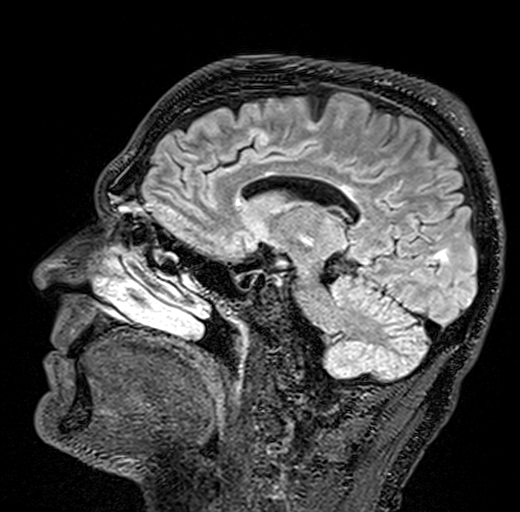
[im 128/192]
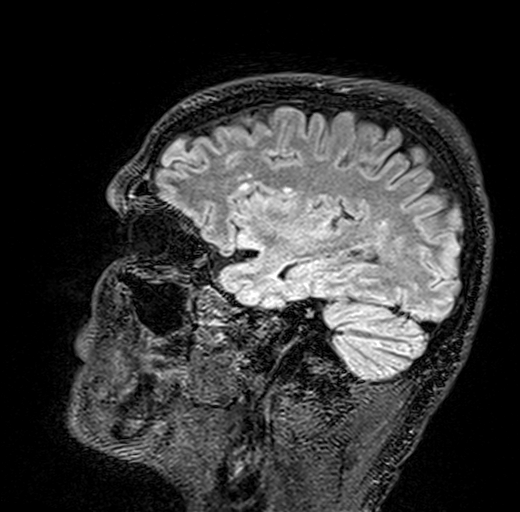
[im 170/192]
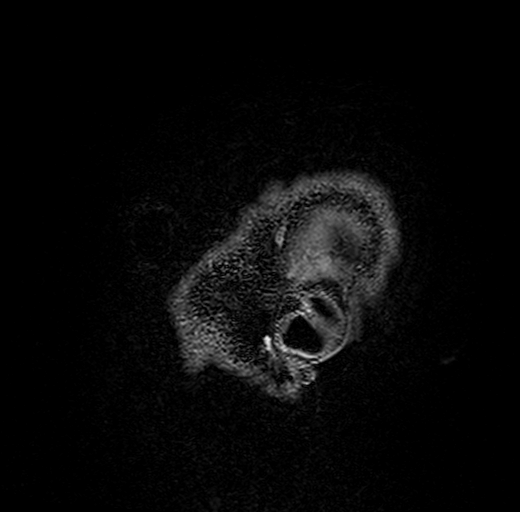
[im 192/192]
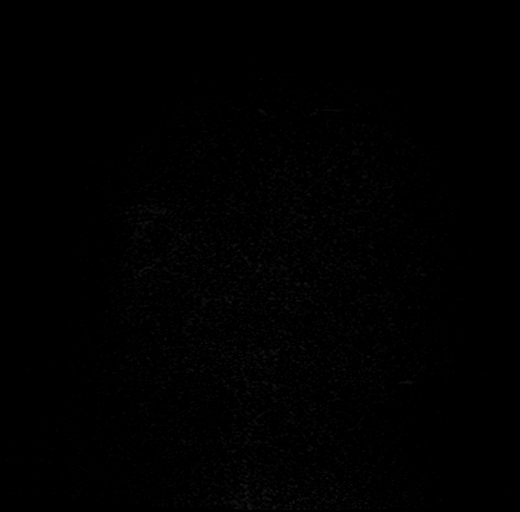

[Series 6: t2_space_dark-fluid_sag_p2_ns-ir_mpr_ axial · axial · 1.0mm · 0.45mm/px · z∈[-48,+69]mm · 6 of 120 slices shown]
[im 1/120]
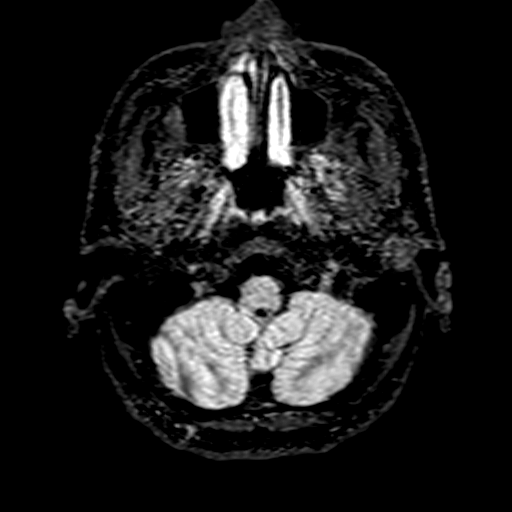
[im 24/120]
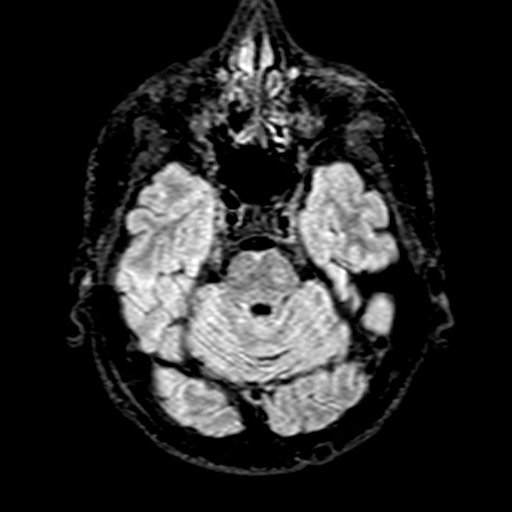
[im 48/120]
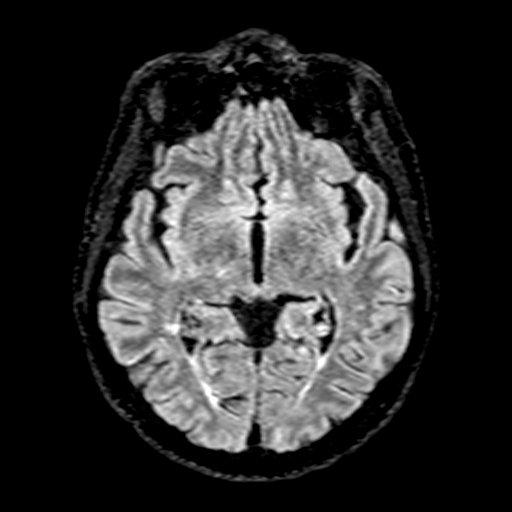
[im 72/120]
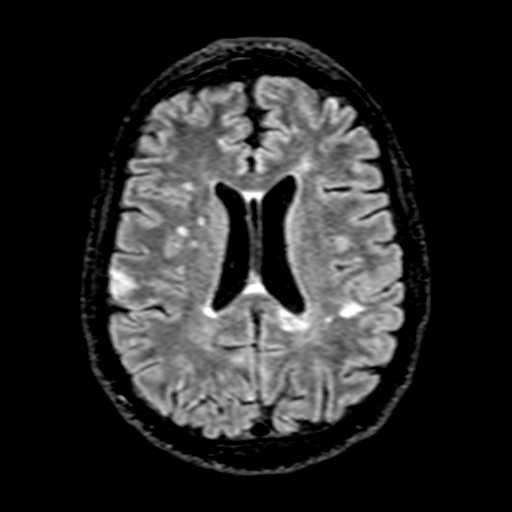
[im 96/120]
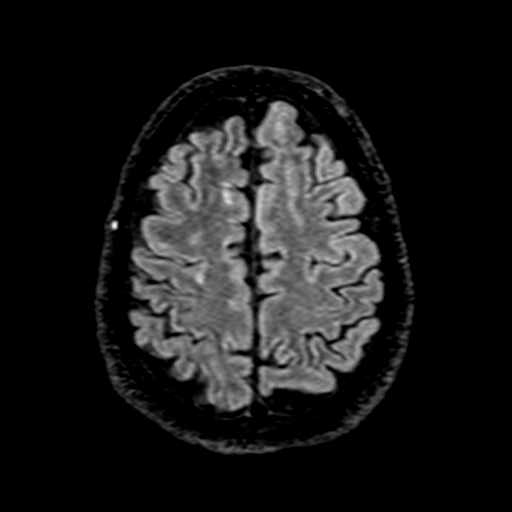
[im 120/120]
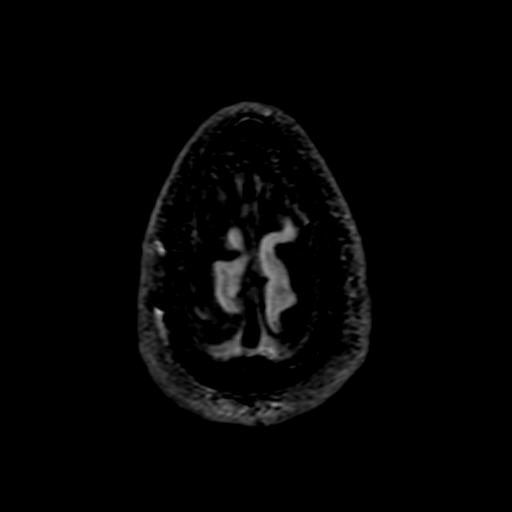

[Series 7: t2_space_dark-fluid_sag_p2_ns-ir_mpr_coronal · coronal · 1.0mm · 0.45mm/px · 1 of 120 slices shown]
[im 1/120]
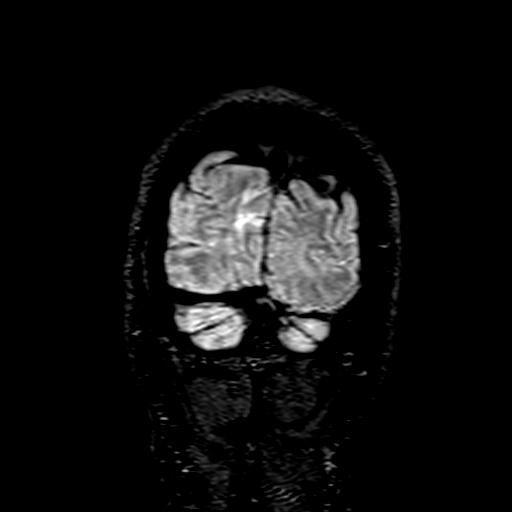

[Series 14: T1 post-contrast · coronal · 5.0mm · 0.34mm/px · 1 of 29 slices shown (1 of 3)]
[im 1/29]
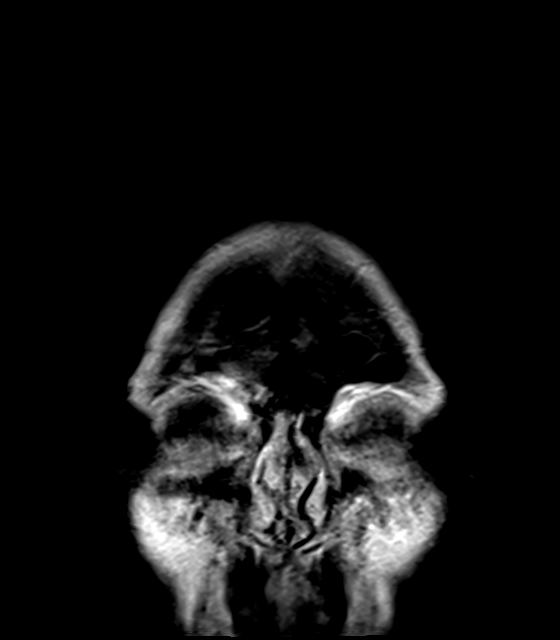

[Series 15: FLAIR · sagittal · 3.0mm · 0.75mm/px · 2 of 35 slices shown]
[im 1/35]
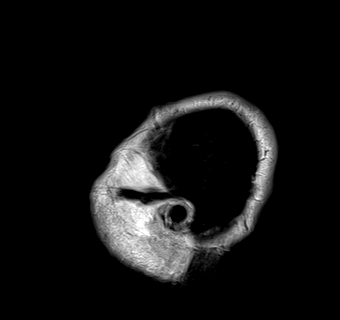
[im 35/35]
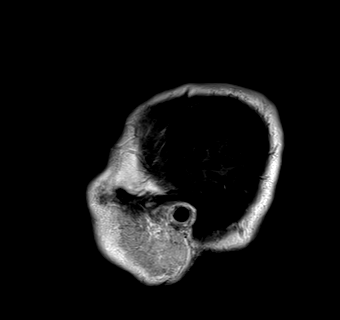

[Series 16: T1 post-contrast · coronal · 5.0mm · 0.34mm/px · 1 of 29 slices shown (2 of 3)]
[im 1/29]
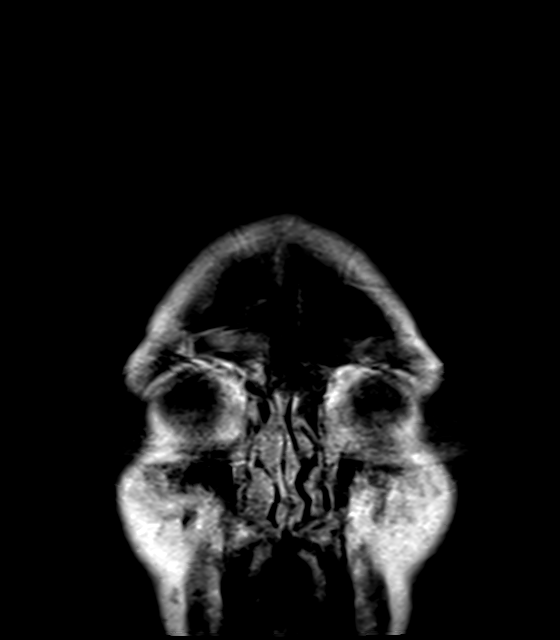

[Series 17: T1 post-contrast · sagittal · 4.0mm · 0.75mm/px · 2 of 31 slices shown (3 of 3)]
[im 1/31]
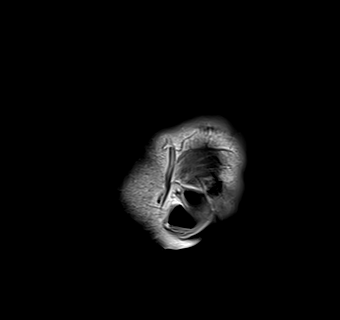
[im 31/31]
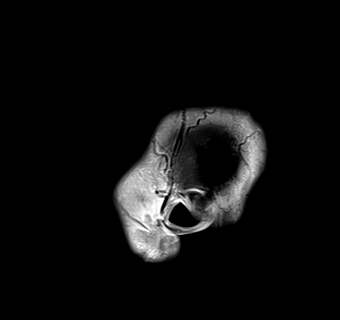

[21 of 48 positions shown; findings below may reference images not displayed]

FINDINGS: Brain: Intermittently motion degraded postcontrast imaging.
Plaque-like findings in the brain with pattern commonly seen in
multiple sclerosis. Left periatrial white matter and right
posterior/inferior frontal juxtacortical plaques are enhancing on
axial initial thin section sequence. The juxtacortical plaque has a
peripheral/leading edge pattern of enhancement. Two small nodular
areas of enhancement are seen or on coronal and sagittal
postcontrast imaging in juxtacortical left parietal cortex and right
corona radiata. On sagittal postcontrast imaging a ventral and
superior pontine plaque is also seen to enhance.

Vascular: Major vascular enhancements are preserved.

Skull and upper cervical spine: Normal marrow signal.

Sinuses/Orbits: Negative
IMPRESSION: Demyelinating pattern with 5 enhancing plaques that are consistent
with active disease.

## 2021-09-03 IMAGING — MR MR THORACIC SPINE WO/W CM
5 of 10 series · 24 of 48 positions shown · IV contrast (gadavist)
Comparison: None Available.

CLINICAL DATA: Demyelinating disease suggested on prior brain MRI.

EXAM:
MRI CERVICAL AND THORACIC SPINE WITHOUT AND WITH CONTRAST
TECHNIQUE: Multiplanar and multiecho pulse sequences of the cervical spine, to
include the craniocervical junction and cervicothoracic junction,
and the thoracic spine, were obtained without and with intravenous
contrast.
CONTRAST:  10mL GADAVIST GADOBUTROL 1 MMOL/ML IV SOLN

[Series 14: T1 · sagittal · 6.0mm · 1.23mm/px · 3 of 9 slices shown (1 of 3)]
[im 1/9]
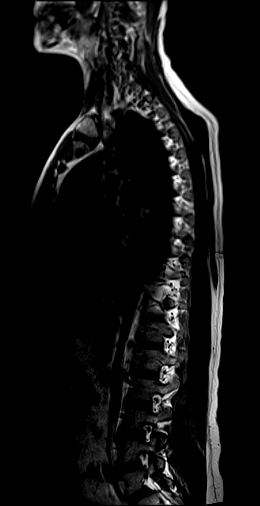
[im 5/9]
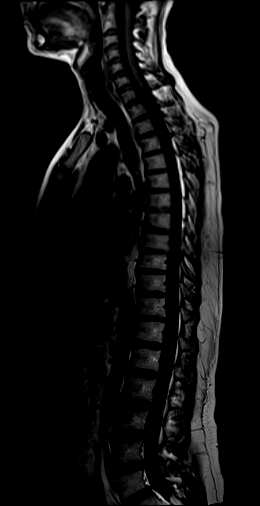
[im 9/9]
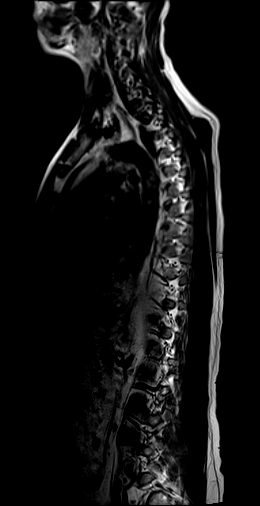

[Series 15: T2 · sagittal · 3.0mm · 0.76mm/px · 4 of 17 slices shown (1 of 2)]
[im 1/17]
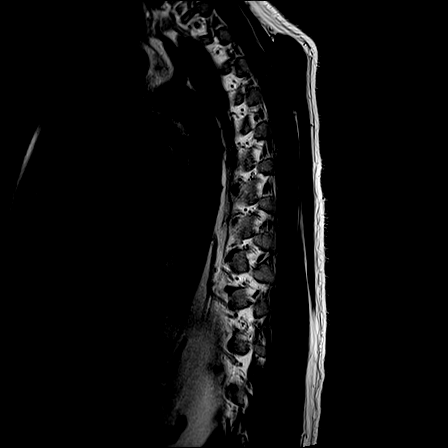
[im 6/17]
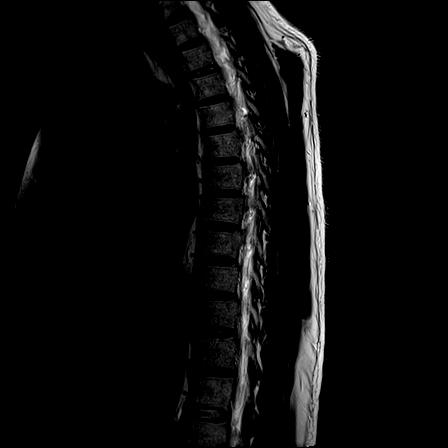
[im 11/17]
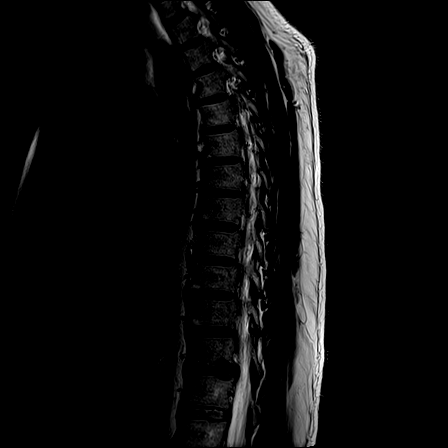
[im 17/17]
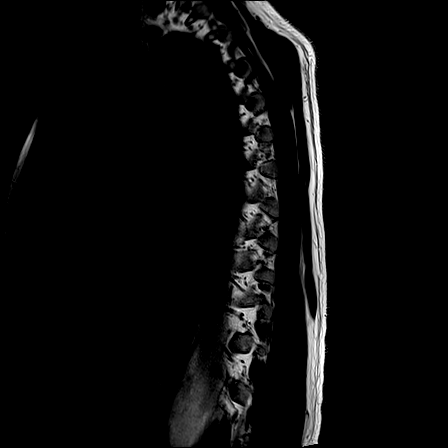

[Series 16: T1 · sagittal · 3.0mm · 0.76mm/px · 4 of 17 slices shown (2 of 3)]
[im 1/17]
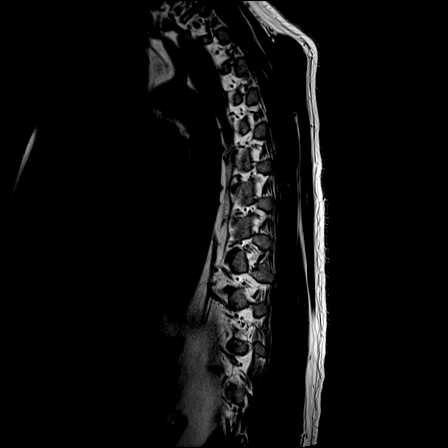
[im 6/17]
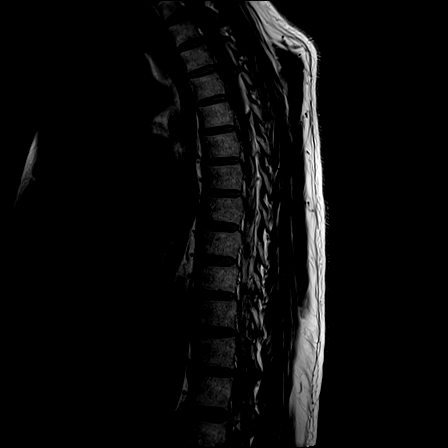
[im 11/17]
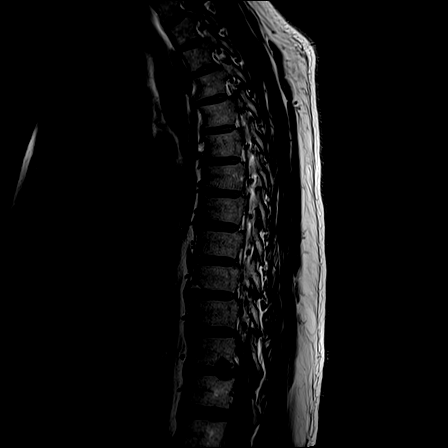
[im 17/17]
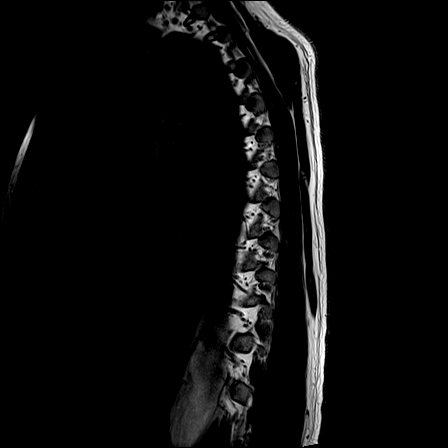

[Series 18: T2 · axial · 4.0mm · 0.59mm/px · z∈[-255,+23]mm · 7 of 39 slices shown (2 of 2)]
[im 1/39]
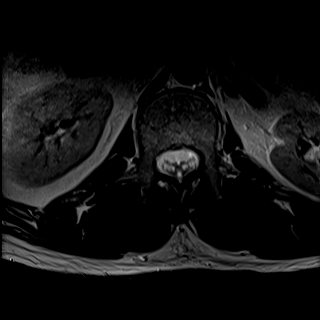
[im 7/39]
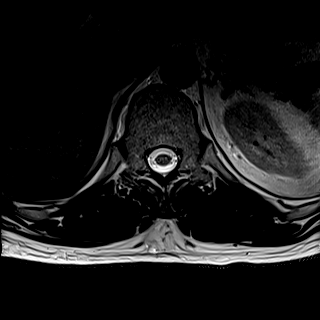
[im 13/39]
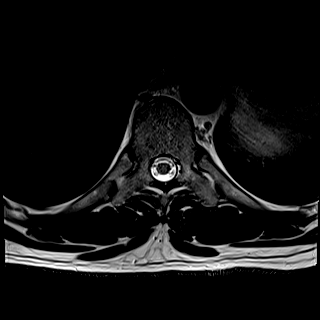
[im 20/39]
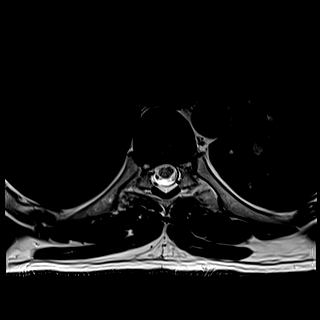
[im 26/39]
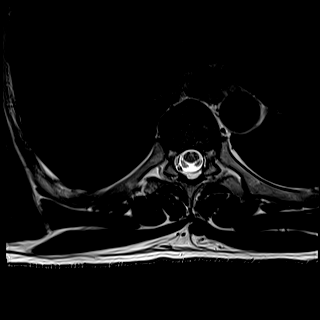
[im 32/39]
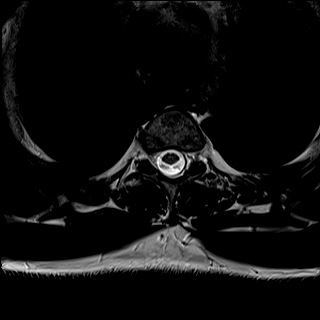
[im 39/39]
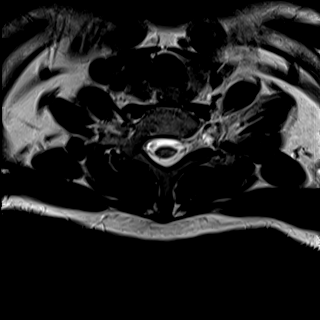

[Series 21: T1 · axial · non-contrast · 4.0mm · 0.31mm/px · z∈[-255,-24]mm · 6 of 39 slices shown (3 of 3)]
[im 1/39]
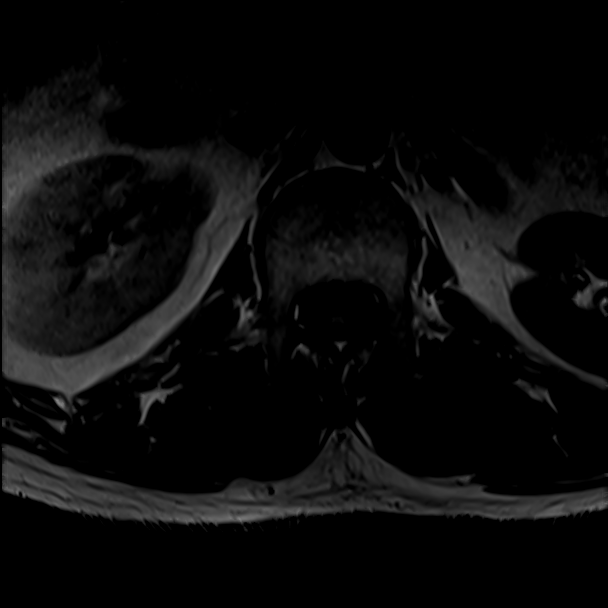
[im 7/39]
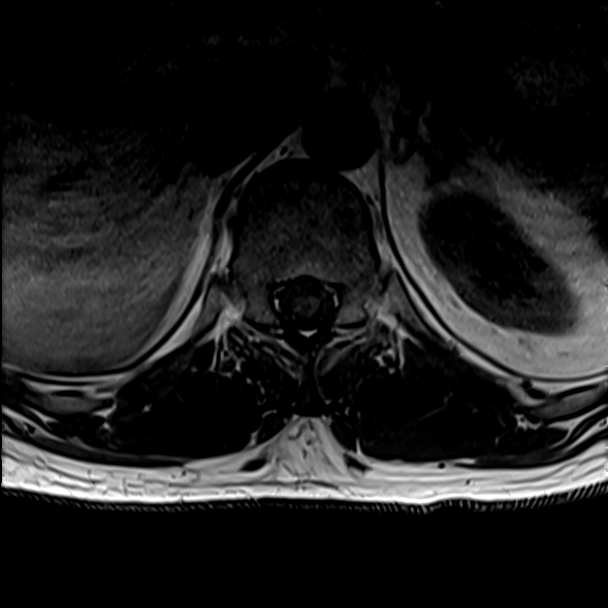
[im 13/39]
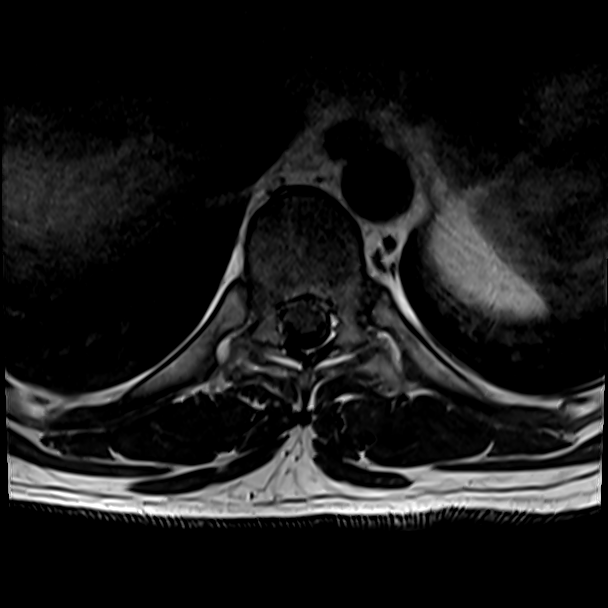
[im 20/39]
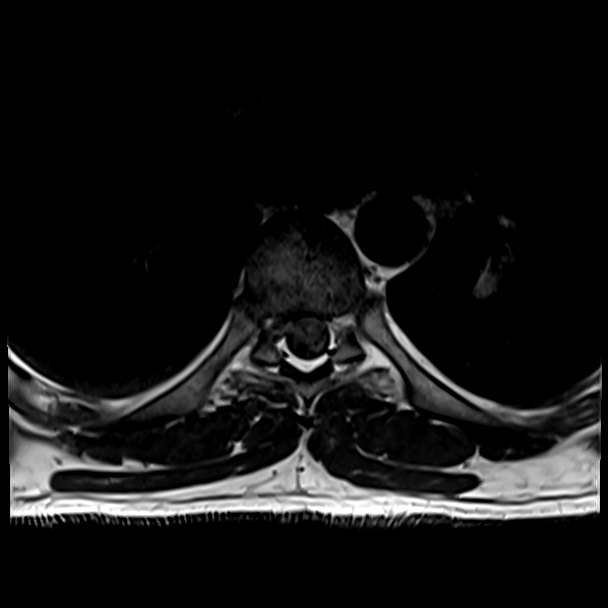
[im 26/39]
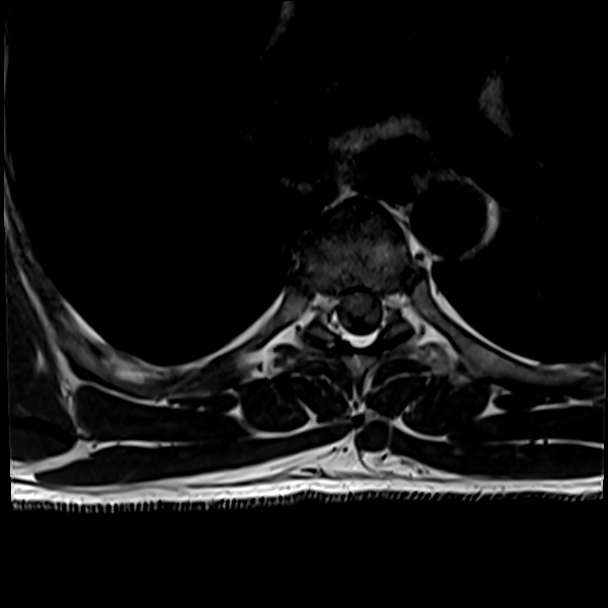
[im 32/39]
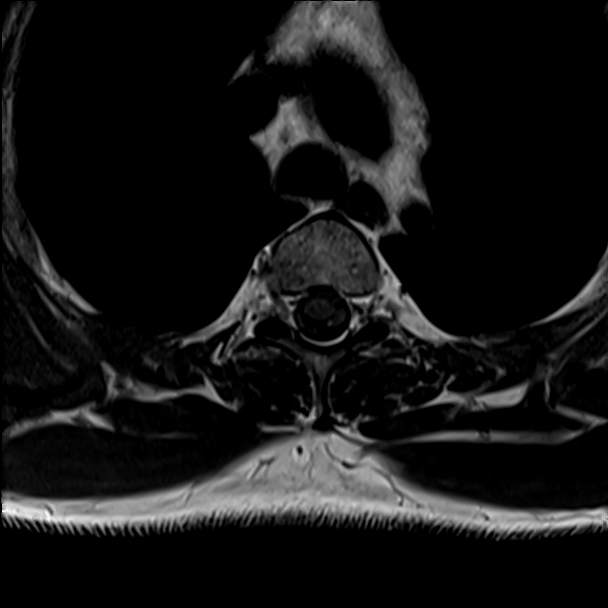

[24 of 48 positions shown; findings below may reference images not displayed]

FINDINGS: MRI CERVICAL SPINE FINDINGS

Alignment: Normal

Vertebrae: No fracture, evidence of discitis, or bone lesion.

Cord: Short segment T2 hyperintensities in the left cord at C7 and
T1, best seen on STIR and axial gradient images. No associated
swelling or enhancement. Longitudinal thin STIR hyperintensity
throughout the cervical cord on sagittal imaging is non pathologic
based on the other sequences.

Posterior Fossa, vertebral arteries, paraspinal tissues: Medullary
T2 hyperintensity which is right para median.

Disc levels:

No significant degenerative change or neural impingement.

MRI THORACIC SPINE FINDINGS

Alignment:  Physiologic.

Vertebrae: No fracture, evidence of discitis, or bone lesion.

Cord:  Short segment T2 hyperintensity in the left hemi cord at T1.

Paraspinal and other soft tissues: Negative

Disc levels:

Mild disc desiccation and narrowing at T6-7 and T11-12 with small
right foraminal herniation at T11-12.
IMPRESSION: Short segment T2 hyperintensities in the left cord at C7 and T1, as
seen with multiple sclerosis. Both are nonenhancing.

## 2021-09-03 MED ORDER — MELATONIN 5 MG PO TABS
5.0000 mg | ORAL_TABLET | Freq: Once | ORAL | Status: AC | PRN
  Administered 2021-09-03: 5 mg via ORAL
  Filled 2021-09-03: qty 1

## 2021-09-03 MED ORDER — VITAMIN B-12 1000 MCG PO TABS
1000.0000 ug | ORAL_TABLET | Freq: Every day | ORAL | Status: DC
  Administered 2021-09-03 – 2021-09-07 (×5): 1000 ug via ORAL
  Filled 2021-09-03 (×5): qty 1

## 2021-09-03 MED ORDER — CLONIDINE HCL 0.1 MG PO TABS: 0.1000 mg | ORAL_TABLET | ORAL | Status: DC | PRN

## 2021-09-03 MED ORDER — ESCITALOPRAM OXALATE 10 MG PO TABS
10.0000 mg | ORAL_TABLET | Freq: Every day | ORAL | Status: DC
  Administered 2021-09-03 – 2021-09-07 (×5): 10 mg via ORAL
  Filled 2021-09-03 (×5): qty 1

## 2021-09-03 MED ORDER — SODIUM CHLORIDE 0.9 % IV SOLN
1000.0000 mg | INTRAVENOUS | Status: AC
  Administered 2021-09-03 – 2021-09-07 (×5): 1000 mg via INTRAVENOUS
  Filled 2021-09-03 (×6): qty 16

## 2021-09-03 MED ORDER — SILODOSIN 8 MG PO CAPS
8.0000 mg | ORAL_CAPSULE | Freq: Every day | ORAL | Status: DC
  Administered 2021-09-03 – 2021-09-07 (×5): 8 mg via ORAL
  Filled 2021-09-03 (×5): qty 1

## 2021-09-03 MED ORDER — DIAZEPAM 5 MG/ML IJ SOLN
5.0000 mg | Freq: Once | INTRAMUSCULAR | Status: AC
  Administered 2021-09-03: 5 mg via INTRAVENOUS
  Filled 2021-09-03: qty 2

## 2021-09-03 MED ORDER — AMPHETAMINE-DEXTROAMPHETAMINE 10 MG PO TABS
30.0000 mg | ORAL_TABLET | ORAL | Status: DC
  Administered 2021-09-05: 30 mg via ORAL
  Filled 2021-09-03: qty 3

## 2021-09-03 MED ORDER — DOCUSATE SODIUM 100 MG PO CAPS
100.0000 mg | ORAL_CAPSULE | Freq: Two times a day (BID) | ORAL | Status: DC
  Administered 2021-09-03 – 2021-09-07 (×7): 100 mg via ORAL
  Filled 2021-09-03 (×8): qty 1

## 2021-09-03 MED ORDER — PANTOPRAZOLE SODIUM 40 MG PO TBEC
40.0000 mg | DELAYED_RELEASE_TABLET | Freq: Every day | ORAL | Status: DC
  Administered 2021-09-03 – 2021-09-06 (×4): 40 mg via ORAL
  Filled 2021-09-03 (×4): qty 1

## 2021-09-03 MED ORDER — ALPRAZOLAM 0.5 MG PO TABS
1.0000 mg | ORAL_TABLET | Freq: Four times a day (QID) | ORAL | Status: DC | PRN
  Administered 2021-09-03 – 2021-09-07 (×16): 1 mg via ORAL
  Filled 2021-09-03 (×2): qty 2
  Filled 2021-09-03: qty 4
  Filled 2021-09-03 (×5): qty 2
  Filled 2021-09-03: qty 4
  Filled 2021-09-03 (×6): qty 2
  Filled 2021-09-03: qty 4

## 2021-09-03 MED ORDER — AMPHETAMINE-DEXTROAMPHET ER 10 MG PO CP24
30.0000 mg | ORAL_CAPSULE | Freq: Every morning | ORAL | Status: DC
  Administered 2021-09-06: 30 mg via ORAL
  Filled 2021-09-03 (×3): qty 3

## 2021-09-03 MED ORDER — ACETAMINOPHEN 325 MG PO TABS
650.0000 mg | ORAL_TABLET | Freq: Four times a day (QID) | ORAL | Status: DC | PRN
  Administered 2021-09-03: 650 mg via ORAL
  Filled 2021-09-03: qty 2

## 2021-09-03 MED ORDER — SENNA 8.6 MG PO TABS
1.0000 | ORAL_TABLET | Freq: Two times a day (BID) | ORAL | Status: DC
  Administered 2021-09-03 – 2021-09-07 (×4): 8.6 mg via ORAL
  Filled 2021-09-03 (×6): qty 1

## 2021-09-03 MED ORDER — SODIUM CHLORIDE 0.9 % IV SOLN: 1000.0000 mg | Freq: Once | INTRAVENOUS | Status: DC

## 2021-09-03 MED ORDER — ONDANSETRON HCL 4 MG/2ML IJ SOLN
4.0000 mg | Freq: Four times a day (QID) | INTRAMUSCULAR | Status: DC | PRN
  Filled 2021-09-03: qty 2

## 2021-09-03 MED ORDER — ONDANSETRON HCL 4 MG PO TABS: 4.0000 mg | ORAL_TABLET | Freq: Four times a day (QID) | ORAL | Status: DC | PRN

## 2021-09-03 MED ORDER — ACETAMINOPHEN 650 MG RE SUPP: 650.0000 mg | Freq: Four times a day (QID) | RECTAL | Status: DC | PRN

## 2021-09-03 MED ORDER — GADOBUTROL 1 MMOL/ML IV SOLN
10.0000 mL | Freq: Once | INTRAVENOUS | Status: AC | PRN
  Administered 2021-09-03: 10 mL via INTRAVENOUS

## 2021-09-03 NOTE — Assessment & Plan Note (Addendum)
-   Continue PPI, increase dose

## 2021-09-03 NOTE — Progress Notes (Signed)
?  Patient has been admitted after midnight and has been seen and evaluated at bedside.   ? ?Briefly, this is a 41 year old Caucasian male with history of ADD, GERD, anxiety, and urinary frequency with pending evaluation with urology who presented as a transfer from med [facility].  He has had dizziness that has been ongoing for the last 3-4 weeks as well as chronic constipation over the last 3-4 months.  He has been noticing that when he looks at pictures of people on social media, that their teeth look abnormally large almost cartoon like.  He is also noticing some clumsiness in his legs.   ? ?Brain MRI and MRI of the spinal cord demonstrate signs of active demyelination.  He has been seen by neurology and started on high-dose IV Solu-Medrol with further evaluation pending.  Plans are to continue high-dose IV Solu-Medrol every 24 hours for 5 doses. ? ?No other specific complaints noted since admission earlier this morning. ? ?Total care time: 30 minutes. ?
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
## 2021-09-03 NOTE — Evaluation (Signed)
Physical Therapy Evaluation ?Patient Details ?Name: Gregg Obrien ?MRN: [id number] ?DOB: 1981/01/23 ?Today's Date: 09/03/2021 ? ?History of Present Illness ? 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  Pt is reporting changes in vision with images of faces being disproportionate on social media, and has coordination changes on LLE, urinary frequency, dizziness.  PMHx:  ADD, GERD, anxiety, urinary frequency  ?Clinical Impression ? Pt was seen for progressing from bed to stand to walk with balance assessment during gait.  Noted mild coordination changes on LLE as well as strength loss on L side, with pt being aware of LLE himself prior to the arrival to ED.  Pt is assisted to safely walk on the hallway, with changes of balance that are causing him to step near obstacles but no outright falls.  Pt is reporting his balance issues being something he has tried to keep less obvious at work, and is fearful of this causing a job loss.  Follow along with him to see if an AD is essential, and to work on control of moving with and without AD.  Acute PT goals are outlined as below, with outpatient therapy to follow up with him.   ?   ? ?Recommendations for follow up therapy are one component of a multi-disciplinary discharge planning process, led by the attending physician.  Recommendations may be updated based on patient status, additional functional criteria and insurance authorization. ? ?Follow Up Recommendations Outpatient PT ? ?  ?Assistance Recommended at Discharge Set up Supervision/Assistance  ?Patient can return home with the following ? Help with stairs or ramp for entrance ? ?  ?Equipment Recommendations Other (comment) (TBD)  ?Recommendations for Other Services ?    ?  ?Functional Status Assessment Patient has had a recent decline in their functional status and demonstrates the ability to make significant improvements in function in a reasonable and  predictable amount of time.  ? ?  ?Precautions / Restrictions Precautions ?Precautions: Fall ?Precaution Comments: unsteady to walk ?Restrictions ?Weight Bearing Restrictions: No  ? ?  ? ?Mobility ? Bed Mobility ?Overal bed mobility: Modified Independent ?  ?  ?  ?  ?  ?  ?General bed mobility comments: a little extra time to get up but is on a gurney ?  ? ?Transfers ?Overall transfer level: Modified independent ?Equipment used: None ?  ?  ?  ?  ?  ?  ?  ?  ?  ? ?Ambulation/Gait ?Ambulation/Gait assistance: Min guard ?Gait Distance (Feet): 150 Feet ?Assistive device: None ?  ?Gait velocity: reduced, variable ?  ?Pre-gait activities: standing balance ck ?General Gait Details: pt is walking on the floor in ED with listing to the R side but also close to obstacles, not clearly avoiding them ? ?Stairs ?  ?  ?  ?  ?  ? ?Wheelchair Mobility ?  ? ?Modified Rankin (Stroke Patients Only) ?  ? ?  ? ?Balance Overall balance assessment: Needs assistance ?Sitting-balance support: Feet supported ?Sitting balance-Leahy Scale: Good ?  ?  ?Standing balance support: No upper extremity supported ?Standing balance-Leahy Scale: Fair ?Standing balance comment: pt is getting up to walk with help, aware of his balance fluctuations with no AD used previously, no falls.  Has been careful at work not to draw attention to it ?  ?  ?  ?  ?  ?  ?  ?  ?  ?  ?  ?   ? ? ? ?  Pertinent Vitals/Pain Pain Assessment ?Pain Assessment: No/denies pain  ? ? ?Home Living Family/patient expects to be discharged to:: Private residence ?Living Arrangements: Alone ?Available Help at Discharge: Other (Comment) (no clear choices per pt) ?Type of Home: Apartment ?Home Access: Stairs to enter ?Entrance Stairs-Rails: None ?Entrance Stairs-Number of Steps: 2 ?  ?Home Layout: One level ?Home Equipment: None ?Additional Comments: has been working in [employer], moving heavy objects  ?  ?Prior Function Prior Level of Function : Independent/Modified  Independent;Working/employed;Driving ?  ?  ?  ?  ?  ?  ?Mobility Comments: no falls but feels unsteady ?ADLs Comments: I per pt, donned shoes alone ?  ? ? ?Hand Dominance  ? Dominant Hand: Right ? ?  ?Extremity/Trunk Assessment  ? Upper Extremity Assessment ?Upper Extremity Assessment: LUE deficits/detail ?LUE Deficits / Details: strength is 4 to 4+ ?  ? ?Lower Extremity Assessment ?Lower Extremity Assessment: LLE deficits/detail ?LLE Deficits / Details: strength is 4 to 4+ ?LLE Coordination: decreased gross motor ?  ? ?Cervical / Trunk Assessment ?Cervical / Trunk Assessment: Normal  ?Communication  ? Communication: No difficulties  ?Cognition Arousal/Alertness: Awake/alert ?Behavior During Therapy: [facility] for tasks assessed/performed ?Overall Cognitive Status: Within Functional Limits for tasks assessed ?  ?  ?  ?  ?  ?  ?  ?  ?  ?  ?  ?  ?  ?  ?  ?  ?  ?  ?  ? ?  ?General Comments General comments (skin integrity, edema, etc.): pt is mildly unsteady and shifting balance with gait, requires min guard in the confined spaces in ED for safety ? ?  ?Exercises    ? ?Assessment/Plan  ?  ?PT Assessment Patient needs continued PT services  ?PT Problem List Decreased strength;Decreased activity tolerance;Decreased balance;Decreased coordination;Decreased knowledge of use of DME ? ?   ?  ?PT Treatment Interventions DME instruction;Gait training;Stair training;Functional mobility training;Therapeutic activities;Therapeutic exercise;Balance training;Neuromuscular re-education;Cognitive remediation;Wheelchair mobility training   ? ?PT Goals (Current goals can be found in the Care Plan section)  ?Acute Rehab PT Goals ?Patient Stated Goal: to get back to work before he loses his job ?PT Goal Formulation: With patient ?Time For Goal Achievement: 09/17/21 ?Potential to Achieve Goals: Good ? ?  ?Frequency Min 3X/week ?  ? ? ?Co-evaluation   ?  ?  ?  ?  ? ? ?  ?AM-PAC PT "6 Clicks" Mobility  ?Outcome Measure Help needed turning from  your back to your side while in a flat bed without using bedrails?: None ?Help needed moving from lying on your back to sitting on the side of a flat bed without using bedrails?: None ?Help needed moving to and from a bed to a chair (including a wheelchair)?: A Little ?Help needed standing up from a chair using your arms (e.g., wheelchair or bedside chair)?: A Little ?Help needed to walk in hospital room?: A Little ?Help needed climbing 3-5 steps with a railing? : A Little ?6 Click Score: 20 ? ?  ?End of Session Equipment Utilized During Treatment: Gait belt ?Activity Tolerance: Other (comment) (strength affecting balance with coordination changes) ?Patient left: in bed;with call bell/phone within reach;with nursing/sitter in room ?Nurse Communication: Mobility status;Other (comment) (GERD bothering him) ?PT Visit Diagnosis: Unsteadiness on feet (R26.81);Muscle weakness (generalized) (M62.81);Difficulty in walking, not elsewhere classified (R26.2);Ataxic gait (R26.0);Other symptoms and signs involving the nervous system (R29.898) ?  ? ?Time: 6283-6629 ?PT Time Calculation (min) (ACUTE ONLY): 13 min ? ? ?Charges:  PT Evaluation ?$PT Eval Moderate Complexity: 1 Mod ?  ?  ?   ? ?Ivar Drape ?09/03/2021, 12:41 PM ? ?Samul Dada, PT PhD ?Acute Rehab Dept. Number: [facility] [phone] and [facility] [phone] ? ?

## 2021-09-03 NOTE — ED Provider Notes (Signed)
I assumed care of this patient.  Please see previous provider note for further details of Hx, PE.  Briefly patient is a 41 y.o. male who presented neurologic symptoms concerning for demyelinating disease.  Neurology consulted who recommended obtaining MRIs with contrast. ? ? ?MRIs did confirm active demyelinating process. ?Patient given 1 g of Solu-Medrol. ?Consulting hospitalist service for admission ? ? ? ?  ?Nira Conn, MD ?09/03/21 [phone] ? ?

## 2021-09-03 NOTE — Assessment & Plan Note (Addendum)
-   Continue Xanax, escitalopram ?

## 2021-09-03 NOTE — Assessment & Plan Note (Addendum)
Suspect MS. ?- Continue Solumedrol day 4 of 5 ?- PT eval ?- Consult Neuro  ?
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
## 2021-09-03 NOTE — ED Notes (Signed)
Breakfast order placed ?

## 2021-09-03 NOTE — ED Notes (Signed)
C/o headache medication given.  ?

## 2021-09-03 NOTE — Discharge Instructions (Signed)
Medicaid online application ?[email or web address] ?

## 2021-09-03 NOTE — Assessment & Plan Note (Addendum)
Hold Adderall

## 2021-09-03 NOTE — Assessment & Plan Note (Addendum)
-   Continue Silodosin ?- Ouptaitent Urology follow up is pending ?
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
## 2021-09-04 DIAGNOSIS — G379 Demyelinating disease of central nervous system, unspecified: Secondary | ICD-10-CM | POA: Diagnosis not present

## 2021-09-04 LAB — CBC
HCT: 40.1 % (ref 39.0–52.0)
Hemoglobin: 13.6 g/dL (ref 13.0–17.0)
MCH: 30.2 pg (ref 26.0–34.0)
MCHC: 33.9 g/dL (ref 30.0–36.0)
MCV: 88.9 fL (ref 80.0–100.0)
Platelets: 282 10*3/uL (ref 150–400)
RBC: 4.51 MIL/uL (ref 4.22–5.81)
RDW: 13 % (ref 11.5–15.5)
WBC: 12.3 10*3/uL — ABNORMAL HIGH (ref 4.0–10.5)
nRBC: 0 % (ref 0.0–0.2)

## 2021-09-04 LAB — MISC LABCORP TEST (SEND OUT): Labcorp test code: 505310

## 2021-09-04 LAB — GLUCOSE, CAPILLARY: Glucose-Capillary: 179 mg/dL — ABNORMAL HIGH (ref 70–99)

## 2021-09-04 LAB — HEMOGLOBIN A1C
Hgb A1c MFr Bld: 5.7 % — ABNORMAL HIGH (ref 4.8–5.6)
Mean Plasma Glucose: 116.89 mg/dL

## 2021-09-04 LAB — NEUROMYELITIS OPTICA AUTOAB, IGG: NMO-IgG: <1.5 U/mL (ref 0.0–3.0)

## 2021-09-04 MED ORDER — INSULIN ASPART 100 UNIT/ML IJ SOLN
0.0000 [IU] | Freq: Three times a day (TID) | INTRAMUSCULAR | Status: DC
  Administered 2021-09-05: 3 [IU] via SUBCUTANEOUS
  Administered 2021-09-05: 1 [IU] via SUBCUTANEOUS
  Administered 2021-09-05 – 2021-09-06 (×2): 2 [IU] via SUBCUTANEOUS
  Administered 2021-09-06: 3 [IU] via SUBCUTANEOUS
  Administered 2021-09-07: 1 [IU] via SUBCUTANEOUS

## 2021-09-04 MED ORDER — MELATONIN 5 MG PO TABS: 5.0000 mg | ORAL_TABLET | Freq: Every evening | ORAL | Status: DC | PRN

## 2021-09-04 MED ORDER — ALUM & MAG HYDROXIDE-SIMETH 200-200-20 MG/5ML PO SUSP
15.0000 mL | ORAL | Status: DC | PRN
  Administered 2021-09-04 – 2021-09-06 (×6): 30 mL via ORAL
  Filled 2021-09-04 (×6): qty 30

## 2021-09-04 MED ORDER — MELATONIN 5 MG PO TABS
5.0000 mg | ORAL_TABLET | Freq: Every day | ORAL | Status: DC
  Administered 2021-09-04 – 2021-09-06 (×3): 5 mg via ORAL
  Filled 2021-09-04 (×3): qty 1

## 2021-09-04 NOTE — Evaluation (Signed)
Occupational Therapy Evaluation ?Patient Details ?Name: Gregg Obrien ?MRN: [id number] ?DOB: 06/05/1980 ?Today's Date: 09/04/2021 ? ? 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  Pt is reporting changes in vision with images of faces being disproportionate on social media, and has coordination changes on LLE, urinary frequency, dizziness.  PMHx:  ADD, GERD, anxiety, urinary frequency  ? ?Clinical Impression ?  ?Pt reports independence at baseline with ADLs and functional mobility, lives with roommate who can provide PRN assist. 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 Pt currently at baseline for ADLs, mod I for bed mobility and transfers. Vision WNL when assessed, pt reports his symptoms have decreased since he has been on medication. Pt expresses concern about returning to work, educated pt on not driving along with safety precautions and alternative options for getting to work. Discussed with case management and physician as well. Pt with decreased memory and safety awareness during cognitive task. Will follow acutely. Recommend OP OT at d/c.   ?   ? ?Recommendations for follow up therapy are one component of a multi-disciplinary discharge planning process, led by the attending physician.  Recommendations may be updated based on patient status, additional functional criteria and insurance authorization.  ? ?Follow Up Recommendations ? Outpatient OT  ?  ?Assistance Recommended at Discharge Intermittent Supervision/Assistance  ?Patient can return home with the following Assistance with cooking/housework;Direct supervision/assist for financial management;Direct supervision/assist for medications management;Assist for transportation ? ?  ?Functional Status Assessment ? Patient has had a recent decline in their functional status and demonstrates the ability to make significant improvements in function in a reasonable and  predictable amount of time.  ?Equipment Recommendations ? None recommended by OT  ?  ?Recommendations for Other Services   ? ? ?  ?Precautions / Restrictions Restrictions ?Weight Bearing Restrictions: No  ? ?  ? ?Mobility Bed Mobility ?Overal bed mobility: Modified Independent ?  ?  ?  ?  ?  ?  ?  ?  ? ?Transfers ?Overall transfer level: Modified independent ?Equipment used: None ?  ?  ?  ?  ?  ?  ?  ?  ?  ? ?  ?Balance Overall balance assessment: Needs assistance ?Sitting-balance support: Feet supported ?Sitting balance-Leahy Scale: Good ?  ?  ?Standing balance support: No upper extremity supported ?Standing balance-Leahy Scale: Fair ?  ?  ?  ?  ?  ?  ?  ?  ?  ?  ?  ?  ?   ? ?ADL either performed or assessed with clinical judgement  ? ?ADL Overall ADL's : At baseline ?  ?  ?  ?  ?  ?  ?  ?  ?  ?  ?  ?  ?  ?  ?  ?  ?  ?  ?  ?General ADL Comments: able to don socks and shirt in sitting/standing position, walks hallway distance x3  ? ? ? ?Vision Baseline Vision/History: 1 Wears glasses ?Ability to See in Adequate Light: 0 Adequate ?Patient Visual Report: Other (comment) (Reports distortion of people's faces, specifically teeth stating they are larger/closer to pt than normal.) ?Vision Assessment?: Yes ?Eye Alignment: Within Functional Limits ?Ocular Range of Motion: Within Functional Limits ?Alignment/Gaze Preference: Within Defined Limits ?Tracking/Visual Pursuits: Able to track stimulus in all quads without difficulty ?Visual Fields: No apparent deficits ?Additional Comments: pt has reported episodes of vision loss and double vision  in the past  ?   ?Perception Perception ?Comments: no apparent deficits ?  ?Praxis   ?  ? ?Pertinent Vitals/Pain Pain Assessment ?Pain Assessment: No/denies pain  ? ? ? ?Hand Dominance Right ?  ?Extremity/Trunk Assessment Upper Extremity Assessment ?Upper Extremity Assessment: Overall WFL for tasks assessed ?  ?Lower Extremity Assessment ?Lower Extremity Assessment: Defer to PT  evaluation ?  ?Cervical / Trunk Assessment ?Cervical / Trunk Assessment: Normal ?  ?Communication   ?  ?Cognition Arousal/Alertness: Awake/alert ?Behavior During Therapy: [facility] for tasks assessed/performed, Anxious ?Overall Cognitive Status: Impaired/Different from baseline ?Area of Impairment: Attention, Memory, Following commands, Safety/judgement, Awareness ?  ?  ?  ?  ?  ?  ?  ?  ?  ?Current Attention Level: Focused ?Memory: Decreased short-term memory ?Following Commands: Follows one step commands consistently ?Safety/Judgement: Decreased awareness of deficits, Decreased awareness of safety ?Awareness: Intellectual ?  ?General Comments: pt scoring 6 on SBT, indicative of questionable impariments, unsure of pt's baseline cognition. Able to follow single step instructions during session without difficutly. Demo's decreased safety awareness; states he has driven in the past with visual impairments and plans to drive at d/c. ?  ?  ?General Comments  VSS on RA ? ?  ?Exercises   ?  ?Shoulder Instructions    ? ? ?Home Living Family/patient expects to be discharged to:: Private residence ?Living Arrangements: Non-relatives/Friends ?Available Help at Discharge: Other (Comment) ?Type of Home: Apartment ?Home Access: Stairs to enter ?Entrance Stairs-Number of Steps: 2 ?Entrance Stairs-Rails: None ?Home Layout: One level ?  ?  ?  ?  ?Bathroom Toilet: Standard ?  ?  ?Home Equipment: None ?  ?Additional Comments: has been working in [employer], moving heavy objects ?  ? ?  ?Prior Functioning/Environment Prior Level of Function : Independent/Modified Independent;Working/employed;Driving ?  ?  ?  ?  ?  ?  ?Mobility Comments: no AD use ?ADLs Comments: does IADLs ?  ? ?  ?  ?OT Problem List: Impaired vision/perception;Decreased cognition;Decreased safety awareness ?  ?   ?OT Treatment/Interventions: Self-care/ADL training;Therapeutic exercise;Visual/perceptual remediation/compensation;Patient/family education;Balance  training;Therapeutic activities;Cognitive remediation/compensation  ?  ?OT Goals(Current goals can be found in the care plan section) Acute Rehab OT Goals ?Patient Stated Goal: to get back to work ?OT Goal Formulation: With patient ?Time For Goal Achievement: 09/18/21 ?Potential to Achieve Goals: Good ?ADL Goals ?Additional ADL Goal #1: Pt will perform 3 step trailmaking task accurately with min v/cing in prep for ADLs. ?Additional ADL Goal #3: Pt will recieve passing score on pillbox test to facilitate independence with medication mgmt  ?OT Frequency: Min 2X/week ?  ? ?Co-evaluation   ?  ?  ?  ?  ? ?  ?AM-PAC OT "6 Clicks" Daily Activity     ?Outcome Measure Help from another person eating meals?: None ?Help from another person taking care of personal grooming?: None ?Help from another person toileting, which includes using toliet, bedpan, or urinal?: None ?Help from another person bathing (including washing, rinsing, drying)?: None ?Help from another person to put on and taking off regular upper body clothing?: None ?Help from another person to put on and taking off regular lower body clothing?: None ?6 Click Score: 24 ?  ?End of Session Nurse Communication: Mobility status ? ?Activity Tolerance: Patient tolerated treatment well ?Patient left: in bed;with call bell/phone within reach ? ?OT Visit Diagnosis: Other symptoms and signs involving cognitive function;Other symptoms and signs involving the nervous system (R29.898);Low vision, both eyes (H54.2)  ?              ?  Time: [id number] ?OT Time Calculation (min): 29 min ?Charges:  OT General Charges ?$OT Visit: 1 Visit ?OT Evaluation ?$OT Eval Moderate Complexity: 1 Mod ?OT Treatments ?$Therapeutic Activity: 8-22 mins ? ?Lynnda Child, OTD, OTR/L ?Acute Rehab ?[phone] ?Kaylyn Lim ?09/04/2021, 11:37 AM ?

## 2021-09-04 NOTE — TOC Initial Note (Signed)
Transition of Care (TOC) - Initial/Assessment Note  ? ? ?Patient Details  ?Name: Gregg Obrien ?MRN: [id number] ?Date of Birth: Jan 13, 1981 ? ?Transition of Care (TOC) CM/SW Contact:    ?Kermit Balo, RN ?Phone Number: ?09/04/2021, 10:05 AM ? ?Clinical Narrative:                 ?Patient is from [place] originally but has been here working in [place]. He is planning to return to [place] in the near future as he has a better support system there.  ?Pt drives self and manages his own medications.  ?Current recommendations are for outpatient therapy. CM will follow for needs when ready for d/c.   ? ?Expected Discharge Plan: OP Rehab ?Barriers to Discharge: Continued Medical Work up ? ? ?Patient Goals and CMS Choice ?  ?  ?Choice offered to / list presented to : Patient ? ?Expected Discharge Plan and Services ?Expected Discharge Plan: OP Rehab ?  ?Discharge Planning Services: CM Consult ?  ?  ?                ?  ?  ?  ?  ?  ?  ?  ?  ?  ?  ? ?Prior Living Arrangements/Services ?  ?Lives with:: Self ?Patient language and need for interpreter reviewed:: Yes ?Do you feel safe going back to the place where you live?: Yes      ?  ?Care giver support system in place?: No (comment) ?  ?Criminal Activity/Legal Involvement Pertinent to Current Situation/Hospitalization: No - Comment as needed ? ?Activities of Daily Living ?Home Assistive Devices/Equipment: None ?ADL Screening (condition at time of admission) ?Patient's cognitive ability adequate to safely complete daily activities?: Yes ?Is the patient deaf or have difficulty hearing?: No ?Does the patient have difficulty seeing, even when wearing glasses/contacts?: No ?Does the patient have difficulty concentrating, remembering, or making decisions?: No ?Patient able to express need for assistance with ADLs?: Yes ?Does the patient have difficulty dressing or bathing?: No ?Independently performs ADLs?: Yes (appropriate for developmental age) ?Does the patient have difficulty walking  or climbing stairs?: No ?Weakness of Legs: None ?Weakness of Arms/Hands: None ? ?Permission Sought/Granted ?  ?  ?   ?   ?   ?   ? ?Emotional Assessment ?Appearance:: Appears stated age ?Attitude/Demeanor/Rapport: Engaged ?Affect (typically observed): Accepting ?Orientation: : Oriented to Self, Oriented to Place, Oriented to  Time, Oriented to Situation ?  ?Psych Involvement: No (comment) ? 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 Diagnosis Date Noted  ? 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 Adult ADHD 09/03/2021  ? Gastroesophageal reflux disease 09/03/2021  ? Anxiety disorder, unspecified 09/03/2021  ? Urinary frequency 09/03/2021  ? Hernia of anterior abdominal wall 06/25/2015  ? ?PCP:  Clayborne Dana, NP ?Pharmacy:   ?[id number] - [place], [place] - [address] AT [facility] [address] ?[address] ?[place] [address] ?Phone: [phone] Fax: [phone] ? ? ? ? ?Social Determinants of Health (SDOH) Interventions ?  ? ?Readmission Risk Interventions ?   ? View : No data to display.  ?  ?  ?  ? ? ? ?

## 2021-09-04 NOTE — Progress Notes (Signed)
? Unnamed Zeien  [id number] DOB: 1980-05-30 DOA: 09/02/2021 ?PCP: Clayborne Dana, NP   ? ?Brief Narrative:  ?41 year old with a history of ADD, GERD, anxiety, and urinary frequency who was transferred to [facility] ER from Med [facility] where he presented with a 3 to 4-week history of dizziness, chronic constipation, urinary frequency, clumsiness in his legs, and unusual visual disturbances.  CT head was unrevealing but with persistence of symptoms he was transferred to [facility] to allow neurology consultation.  MRI brain was concerning for a possible demyelinating pattern.  He was also noted to have cord enhancement at the C7-T1 level. ? ?Consultants:  ?Neurology ? ?Goals of Care:  Code Status: Full Code  ? ?DVT prophylaxis: ?SCDs ? ?Interim Hx: ?Afebrile.  Vital signs stable.  No new complaints at the time of my exam.  Denies chest pain shortness of breath fevers or chills.  Persisting symptoms are without significant change. ? ?Assessment & Plan: ? ?Demyelinating changes in brain  ?Has been placed on high-dose steroid for a 5-day IV load - laboratory evaluation ordered by neurology - for eventual LP for diagnostic confirmation in the outpatient setting ? ?Anxiety disorder, unspecified ?Continue xanax -well managed at present ? ?Gastroesophageal reflux disease ?Continue PPI -and Maalox as needed ? ?Adult ADHD ?Continue adderrall ? ?Urinary frequency ?Long standing - states that flomax did not help - has appointment with Urology in several weeks. ? ?Family Communication: No family present at time of exam ?Disposition: Of note patient actually lives in [place] and is here on work - plans to return to [place] as soon as able ? ? ?Objective: ?Blood pressure 116/67, pulse 64, temperature 98.2 ?F (36.8 ?C), resp. rate 18, height 6' (1.829 m), weight 95.3 kg, SpO2 96 %. ? ?Intake/Output Summary (Last 24 hours) at 09/04/2021 1032 ?Last data filed at 09/04/2021 0300 ?Gross per 24 hour  ?Intake 480 ml  ?Output --  ?Net 480  ml  ? ?Filed Weights  ? 09/02/21 1540 09/04/21 0500  ?Weight: 95.3 kg 95.3 kg  ? ? ?Examination: ?General: No acute respiratory distress ?Lungs: Clear to auscultation bilaterally without wheezes or crackles ?Cardiovascular: Regular rate and rhythm without murmur gallop or rub normal S1 and S2 ?Abdomen: Nontender, nondistended, soft, bowel sounds positive, no rebound, no ascites, no appreciable mass ?Extremities: No significant cyanosis, clubbing, or edema bilateral lower extremities ?Neuro: as documented in Neuro progress note ? ?CBC: ?Recent Labs  ?Lab 09/02/21 ?1637 09/04/21 ?0157  ?WBC 7.3 12.3*  ?HGB 14.5 13.6  ?HCT 42.4 40.1  ?MCV 88.3 88.9  ?PLT 293 282  ? ?Basic Metabolic Panel: ?Recent Labs  ?Lab 09/02/21 ?1637  ?NA 137  ?K 3.5  ?CL 107  ?CO2 24  ?GLUCOSE 120*  ?BUN 12  ?CREATININE 0.76  ?CALCIUM 8.9  ? ?GFR: ?Estimated Creatinine Clearance: 147 mL/min (by C-G formula based on SCr of 0.76 mg/dL). ? ?HbA1C: ?Hgb A1c MFr Bld  ?Date/Time Value Ref Range Status  ?09/04/2021 01:57 AM 5.7 (H) 4.8 - 5.6 % Final  ?  Comment:  ?  (NOTE) ?Pre diabetes:          5.7%-6.4% ? ?Diabetes:              >6.4% ? ?Glycemic control for   <7.0% ?adults with diabetes ?  ? ? ?Scheduled Meds: ? amphetamine-dextroamphetamine  30 mg Oral q morning  ? amphetamine-dextroamphetamine  30 mg Oral PC lunch  ? docusate sodium  100 mg Oral BID  ? escitalopram  10  mg Oral Daily  ? pantoprazole  40 mg Oral Daily  ? senna  1 tablet Oral BID  ? silodosin  8 mg Oral Daily  ? vitamin B-12  1,000 mcg Oral Daily  ? ?Continuous Infusions: ? methylPREDNISolone (SOLU-MEDROL) injection 1,000 mg (09/04/21 0488)  ? ? ? LOS: 1 day  ? ?Lonia Blood, MD ?[facility] ?Office  [phone] ?Pager - Text Page per Loretha Stapler ? ?If 7PM-7AM, please contact night-coverage per Amion ?09/04/2021, 10:32 AM ? ? ? ? ?
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
## 2021-09-04 NOTE — Plan of Care (Signed)
  Problem: Safety: Goal: Ability to remain free from injury will improve Outcome: Progressing   

## 2021-09-05 DIAGNOSIS — F419 Anxiety disorder, unspecified: Secondary | ICD-10-CM | POA: Diagnosis not present

## 2021-09-05 DIAGNOSIS — R7989 Other specified abnormal findings of blood chemistry: Secondary | ICD-10-CM | POA: Diagnosis not present

## 2021-09-05 DIAGNOSIS — F909 Attention-deficit hyperactivity disorder, unspecified type: Secondary | ICD-10-CM | POA: Diagnosis not present

## 2021-09-05 DIAGNOSIS — G379 Demyelinating disease of central nervous system, unspecified: Secondary | ICD-10-CM | POA: Diagnosis not present

## 2021-09-05 LAB — GLUCOSE, CAPILLARY
Glucose-Capillary: 149 mg/dL — ABNORMAL HIGH (ref 70–99)
Glucose-Capillary: 210 mg/dL — ABNORMAL HIGH (ref 70–99)
Glucose-Capillary: 139 mg/dL — ABNORMAL HIGH (ref 70–99)
Glucose-Capillary: 185 mg/dL — ABNORMAL HIGH (ref 70–99)
Glucose-Capillary: 184 mg/dL — ABNORMAL HIGH (ref 70–99)

## 2021-09-05 LAB — COMPREHENSIVE METABOLIC PANEL
ALT: 19 U/L (ref 0–44)
AST: 15 U/L (ref 15–41)
Albumin: 3.6 g/dL (ref 3.5–5.0)
Alkaline Phosphatase: 78 U/L (ref 38–126)
Anion gap: 7 (ref 5–15)
BUN: 13 mg/dL (ref 6–20)
CO2: 26 mmol/L (ref 22–32)
Calcium: 9.2 mg/dL (ref 8.9–10.3)
Chloride: 104 mmol/L (ref 98–111)
Creatinine, Ser: 0.72 mg/dL (ref 0.61–1.24)
GFR, Estimated: >60 mL/min (ref >60)
Glucose, Bld: 132 mg/dL — ABNORMAL HIGH (ref 70–99)
Potassium: 4.1 mmol/L (ref 3.5–5.1)
Sodium: 137 mmol/L (ref 135–145)
Total Bilirubin: 0.2 mg/dL — ABNORMAL LOW (ref 0.3–1.2)
Total Protein: 6.5 g/dL (ref 6.5–8.1)

## 2021-09-05 LAB — CBC
HCT: 39.7 % (ref 39.0–52.0)
Hemoglobin: 13.2 g/dL (ref 13.0–17.0)
MCH: 30.1 pg (ref 26.0–34.0)
MCHC: 33.2 g/dL (ref 30.0–36.0)
MCV: 90.4 fL (ref 80.0–100.0)
Platelets: 291 10*3/uL (ref 150–400)
RBC: 4.39 MIL/uL (ref 4.22–5.81)
RDW: 13.2 % (ref 11.5–15.5)
WBC: 14 10*3/uL — ABNORMAL HIGH (ref 4.0–10.5)
nRBC: 0 % (ref 0.0–0.2)

## 2021-09-05 LAB — MAGNESIUM: Magnesium: 1.9 mg/dL (ref 1.7–2.4)

## 2021-09-05 LAB — PHOSPHORUS: Phosphorus: 3.7 mg/dL (ref 2.5–4.6)

## 2021-09-05 LAB — TSH: TSH: 0.109 u[IU]/mL — ABNORMAL LOW (ref 0.350–4.500)

## 2021-09-05 NOTE — Progress Notes (Addendum)
Overnight progress note ? ?Notified by RN that patient is bradycardic with heart rate mostly in the 50s but as low as 49, sinus rhythm.  Patient awake and asymptomatic.  Blood pressure stable. ? ?Not on any beta-blockers or calcium channel blockers. He is on Adderall which should cause the opposite effect. ? ?-Cardiac monitoring ?-Stat EKG ?-Check TSH level ? ?Addendum/update 09/05/2021 at 4:11 AM: EKG showing sinus bradycardia, heart rate 44.  Patient remains asymptomatic and blood pressure stable.  Give atropine if he develops hypotension.  Will also check electrolytes. ?
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
## 2021-09-05 NOTE — Progress Notes (Signed)
Physical Therapy Treatment ?Patient Details ?Name: Gregg Obrien ?MRN: [id number] ?DOB: 1981/01/17 ?Today's Date: 09/05/2021 ? ? 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  Pt is reporting changes in vision with images of faces being disproportionate on social media, and has coordination changes on LLE, urinary frequency, dizziness.  PMHx:  ADD, GERD, anxiety, urinary frequency ? ?  ?PT Comments  ? ? Pt demonstrates independence with bed mobility and transfers. Supervision ambulation 500' without AD, and supervision ascend/descend 5 steps x 2 without rails. Mild weakness noted L hip but minimal impact on functional mobility. He reports the visual deficits/disturbances he was experiencing have subsided. He has completed 3 days of IV steroids. PT to continue to follow acutely. No follow up services indicated. ? ?   ?Recommendations for follow up therapy are one component of a multi-disciplinary discharge planning process, led by the attending physician.  Recommendations may be updated based on patient status, additional functional criteria and insurance authorization. ? ?Follow Up Recommendations ? No PT follow up ?  ?  ?Assistance Recommended at Discharge PRN  ?Patient can return home with the following   ?  ?Equipment Recommendations ? None recommended by PT  ?  ?Recommendations for Other Services   ? ? ?  ?Precautions / Restrictions Precautions ?Precautions: Fall  ?  ? ?Mobility ? Bed Mobility ?Overal bed mobility: Modified Independent ?  ?  ?  ?  ?  ?  ?  ?  ? ?Transfers ?Overall transfer level: Modified independent ?  ?  ?  ?  ?  ?  ?  ?  ?  ?  ? ?Ambulation/Gait ?Ambulation/Gait assistance: Supervision ?Gait Distance (Feet): 500 Feet ?Assistive device: None ?Gait Pattern/deviations: Step-through pattern, Decreased stride length ?Gait velocity: reduced, variable ?Gait velocity interpretation: >2.62 ft/sec, indicative of  community ambulatory ?  ?General Gait Details: steady gait ? ? ?Stairs ?Stairs: Yes ?Stairs assistance: Supervision ?Stair Management: No rails, Alternating pattern, Forwards ?Number of Stairs: 5 (x 2) ?  ? ? ?Wheelchair Mobility ?  ? ?Modified Rankin (Stroke Patients Only) ?  ? ? ?  ?Balance Overall balance assessment: Mild deficits observed, not formally tested ?  ?  ?  ?  ?  ?  ?  ?  ?  ?  ?  ?  ?  ?  ?  ?  ?  ?  ?  ? ?  ?Cognition Arousal/Alertness: Awake/alert ?Behavior During Therapy: [facility] for tasks assessed/performed, Anxious ?Overall Cognitive Status: Within Functional Limits for tasks assessed ?  ?  ?  ?  ?  ?  ?  ?  ?  ?  ?  ?  ?  ?  ?  ?  ?  ?  ?  ? ?  ?Exercises   ? ?  ?General Comments   ?  ?  ? ?Pertinent Vitals/Pain Pain Assessment ?Pain Assessment: No/denies pain  ? ? ?Home Living   ?  ?  ?  ?  ?  ?  ?  ?  ?  ?   ?  ?Prior Function    ?  ?  ?   ? ?PT Goals (current goals can now be found in the care plan section) Acute Rehab PT Goals ?Patient Stated Goal: move back to [place] ?Progress towards PT goals: Progressing toward goals ? ?  ?Frequency ? ? ? Min 3X/week ? ? ? ?  ?PT  Plan Discharge plan needs to be updated  ? ? ?Co-evaluation   ?  ?  ?  ?  ? ?  ?AM-PAC PT "6 Clicks" Mobility   ?Outcome Measure ? Help needed turning from your back to your side while in a flat bed without using bedrails?: None ?Help needed moving from lying on your back to sitting on the side of a flat bed without using bedrails?: None ?Help needed moving to and from a bed to a chair (including a wheelchair)?: None ?Help needed standing up from a chair using your arms (e.g., wheelchair or bedside chair)?: None ?Help needed to walk in hospital room?: A Little ?Help needed climbing 3-5 steps with a railing? : A Little ?6 Click Score: 22 ? ?  ?End of Session   ?Activity Tolerance: Patient tolerated treatment well ?Patient left: in bed;with call bell/phone within reach ?Nurse Communication: Mobility status ?PT Visit Diagnosis:  Unsteadiness on feet (R26.81);Muscle weakness (generalized) (M62.81);Difficulty in walking, not elsewhere classified (R26.2);Ataxic gait (R26.0);Other symptoms and signs involving the nervous system (R29.898) ?  ? ? ?Time: 2706-2376 ?PT Time Calculation (min) (ACUTE ONLY): 12 min ? ?Charges:  $Gait Training: 8-22 mins          ?          ? ?Aida Raider, PT  ?Office # [phone] ?Pager [phone] ? ? ? ?Ilda Foil ?09/05/2021, 10:15 AM ? ?

## 2021-09-05 NOTE — Assessment & Plan Note (Addendum)
fT4 normal - Follow T3

## 2021-09-05 NOTE — Progress Notes (Signed)
Occupational Therapy Treatment ?Patient Details ?Name: Gregg Obrien ?MRN: [id number] ?DOB: 1980-11-16 ?Today's Date: 09/05/2021 ? ? ?History of present illness 41 yo male with new onset MS changes on brain and spinal cord with active demyelination was admitted on 5/14.  Pt is reporting changes in vision with images of faces being disproportionate on social media, and has coordination changes on LLE, urinary frequency, dizziness.  PMHx:  ADD, GERD, anxiety, urinary frequency ?  ?OT comments ? Pt seen for OT session, focused on cognition and medication mgmt tasks. Pt able to perform 3 step trail making task in hallway accurately. Administered pillbox assessment, pt making ~20 errors, looking towards OT for reassurance throughout. Pt taking longer than the 5 allotted minutes to complete, with one interruption from NT entering room. Pt will need supervision for med mgmt at d/c. Pt continues to report improvement/resolution in visual symptoms since starting medication 3 days ago. Pt presenting with impairments listed below, will follow acutely. Continue to recommend OP OT at d/c.  ? ?Recommendations for follow up therapy are one component of a multi-disciplinary discharge planning process, led by the attending physician.  Recommendations may be updated based on patient status, additional functional criteria and insurance authorization. ?   ?Follow Up Recommendations ? Outpatient OT  ?  ?Assistance Recommended at Discharge Intermittent Supervision/Assistance  ?Patient can return home with the following ? Assistance with cooking/housework;Direct supervision/assist for financial management;Direct supervision/assist for medications management;Assist for transportation ?  ?Equipment Recommendations ? None recommended by OT  ?  ?Recommendations for Other Services   ? ?  ?Precautions / Restrictions Precautions ?Precautions: Fall ?Precaution Comments: unsteady to walk ?Restrictions ?Weight Bearing Restrictions: No  ? ? ?   ? ?Mobility Bed Mobility ?Overal bed mobility: Modified Independent ?  ?  ?  ?  ?  ?  ?  ?  ? ?Transfers ?Overall transfer level: Modified independent ?Equipment used: None ?  ?  ?  ?  ?  ?  ?  ?  ?  ?  ?Balance Overall balance assessment: Mild deficits observed, not formally tested ?  ?  ?  ?  ?  ?  ?  ?  ?  ?  ?  ?  ?  ?  ?  ?  ?  ?  ?   ? ?ADL either performed or assessed with clinical judgement  ? ?ADL Overall ADL's : At baseline ?  ?  ?  ?  ?  ?  ?  ?  ?  ?  ?  ?  ?  ?  ?  ?  ?  ?  ?  ?General ADL Comments: dons socks/shoes in prep for hallway ambulation ?  ? ?Extremity/Trunk Assessment Upper Extremity Assessment ?Upper Extremity Assessment: Overall WFL for tasks assessed ?  ?Lower Extremity Assessment ?Lower Extremity Assessment: Defer to PT evaluation ?  ?  ?  ? ?Vision   ?Additional Comments: pt has reported no changes in vision since starting medication, continues to wear glasses at baseline ?  ?Perception Perception ?Perception: Within Functional Limits ?  ?Praxis Praxis ?Praxis: Intact ?  ? ?Cognition Arousal/Alertness: Awake/alert ?Behavior During Therapy: [facility] for tasks assessed/performed, Anxious ?Overall Cognitive Status: Within Functional Limits for tasks assessed ?Area of Impairment: Attention, Memory, Following commands, Safety/judgement, Awareness ?  ?  ?  ?  ?  ?  ?  ?  ?  ?Current Attention Level: Focused ?Memory: Decreased short-term memory ?Following Commands: Follows one step commands consistently ?Safety/Judgement: Decreased awareness of deficits,  Decreased awareness of safety ?Awareness: Intellectual ?  ?General Comments: Pt able to complete 3 step trailmaking task accurately; pt with failing score on pillbox test, reads labels correctly aloud. Reports confusion if pillbox was for day/week and some wording on bottles. ?  ?  ?   ?Exercises   ? ?  ?Shoulder Instructions   ? ? ?  ?General Comments VSS on RA  ? ? ?Pertinent Vitals/ Pain       Pain Assessment ?Pain Assessment: No/denies  pain ? ?Home Living   ?  ?  ?  ?  ?  ?  ?  ?  ?  ?  ?  ?  ?  ?  ?  ?  ?  ?  ? ?  ?Prior Functioning/Environment    ?  ?  ?  ?   ? ?Frequency ? Min 2X/week  ? ? ? ? ?  ?Progress Toward Goals ? ?OT Goals(current goals can now be found in the care plan section) ? Progress towards OT goals: Progressing toward goals ? ?Acute Rehab OT Goals ?Patient Stated Goal: none stated ?OT Goal Formulation: With patient ?Time For Goal Achievement: 09/18/21 ?Potential to Achieve Goals: Good ?ADL Goals ?Additional ADL Goal #1: Pt will perform 3 step trailmaking task accurately with min v/cing in prep for ADLs. ?Additional ADL Goal #3: Pt will recieve passing score on pillbox test to facilitate independence with medication mgmt  ?Plan Discharge plan remains appropriate;Frequency remains appropriate   ? ?Co-evaluation ? ? ?   ?  ?  ?  ?  ? ?  ?AM-PAC OT "6 Clicks" Daily Activity     ?Outcome Measure ? ? Help from another person eating meals?: None ?Help from another person taking care of personal grooming?: None ?Help from another person toileting, which includes using toliet, bedpan, or urinal?: None ?Help from another person bathing (including washing, rinsing, drying)?: None ?Help from another person to put on and taking off regular upper body clothing?: None ?Help from another person to put on and taking off regular lower body clothing?: None ?6 Click Score: 24 ? ?  ?End of Session   ? ?OT Visit Diagnosis: Other symptoms and signs involving cognitive function;Other symptoms and signs involving the nervous system (R29.898);Low vision, both eyes (H54.2) ?  ?Activity Tolerance Patient tolerated treatment well ?  ?Patient Left in bed;with call bell/phone within reach ?  ?Nurse Communication Mobility status ?  ? ?   ? ?Time: 6767-2094 ?OT Time Calculation (min): 27 min ? ?Charges: OT General Charges ?$OT Visit: 1 Visit ?OT Treatments ?$Therapeutic Activity: 23-37 mins ? ?Alfonzo Beers, OTD, OTR/L ?Acute Rehab ?[phone] ? ? ?Mayer Masker ?09/05/2021, 5:24 PM ?

## 2021-09-05 NOTE — Plan of Care (Signed)
?  Problem: Clinical Measurements: ?Goal: Diagnostic test results will improve ?Outcome: Not Progressing ?  ?Problem: Pain Managment: ?Goal: General experience of comfort will improve ?Outcome: Not Progressing ?  ?Problem: Elimination: ?Goal: Will not experience complications related to bowel motility ?Outcome: Not Progressing ?  ?Problem: Safety: ?Goal: Ability to remain free from injury will improve ?Outcome: Not Progressing ?  ?
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
## 2021-09-06 ENCOUNTER — Ambulatory Visit: Payer: 59 | Admitting: Urology

## 2021-09-06 DIAGNOSIS — G379 Demyelinating disease of central nervous system, unspecified: Secondary | ICD-10-CM | POA: Diagnosis not present

## 2021-09-06 LAB — GLUCOSE, CAPILLARY
Glucose-Capillary: 118 mg/dL — ABNORMAL HIGH (ref 70–99)
Glucose-Capillary: 191 mg/dL — ABNORMAL HIGH (ref 70–99)
Glucose-Capillary: 239 mg/dL — ABNORMAL HIGH (ref 70–99)
Glucose-Capillary: 203 mg/dL — ABNORMAL HIGH (ref 70–99)

## 2021-09-06 LAB — T4, FREE: Free T4: 0.82 ng/dL (ref 0.61–1.12)

## 2021-09-06 LAB — CBC
HCT: 39 % (ref 39.0–52.0)
Hemoglobin: 12.9 g/dL — ABNORMAL LOW (ref 13.0–17.0)
MCH: 29.9 pg (ref 26.0–34.0)
MCHC: 33.1 g/dL (ref 30.0–36.0)
MCV: 90.5 fL (ref 80.0–100.0)
Platelets: 287 10*3/uL (ref 150–400)
RBC: 4.31 MIL/uL (ref 4.22–5.81)
RDW: 13.2 % (ref 11.5–15.5)
WBC: 14.5 10*3/uL — ABNORMAL HIGH (ref 4.0–10.5)
nRBC: 0 % (ref 0.0–0.2)

## 2021-09-06 MED ORDER — PANTOPRAZOLE SODIUM 40 MG PO TBEC
40.0000 mg | DELAYED_RELEASE_TABLET | Freq: Once | ORAL | Status: AC
  Administered 2021-09-06: 40 mg via ORAL
  Filled 2021-09-06: qty 1

## 2021-09-06 MED ORDER — PANTOPRAZOLE SODIUM 40 MG PO TBEC
80.0000 mg | DELAYED_RELEASE_TABLET | Freq: Every day | ORAL | Status: DC
  Administered 2021-09-07: 80 mg via ORAL
  Filled 2021-09-06: qty 2

## 2021-09-06 MED ORDER — ALUM & MAG HYDROXIDE-SIMETH 200-200-20 MG/5ML PO SUSP
30.0000 mL | Freq: Once | ORAL | Status: AC
  Administered 2021-09-07: 30 mL via ORAL
  Filled 2021-09-06: qty 30

## 2021-09-06 MED ORDER — LIDOCAINE VISCOUS HCL 2 % MT SOLN
15.0000 mL | Freq: Once | OROMUCOSAL | Status: AC
  Administered 2021-09-07: 15 mL via ORAL
  Filled 2021-09-06: qty 15

## 2021-09-06 NOTE — Plan of Care (Signed)
  Problem: Clinical Measurements: Goal: Diagnostic test results will improve Outcome: Progressing   Problem: Activity: Goal: Risk for activity intolerance will decrease Outcome: Progressing   Problem: Nutrition: Goal: Adequate nutrition will be maintained Outcome: Progressing   Problem: Coping: Goal: Level of anxiety will decrease Outcome: Progressing   

## 2021-09-06 NOTE — Progress Notes (Signed)
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  Pt is reporting changes in vision with images of faces being disproportionate on social media, and has coordination changes on LLE, urinary frequency, dizziness.  PMHx:  ADD, GERD, anxiety, urinary frequency    PT Comments    Focus of session balance challenge especially with decreased L LE strength and coordination. Pt worked on single leg stance in front of the sink. Pt requests stair training. Decreased safety awareness after catching L foot on steps. Cuing for slowing down and use of rails for safety. Pt with decreased awareness of L LE deficits increase with fatigue. Overall at a functional level and will not need additional PT services at discharge.      Recommendations for follow up therapy are one component of a multi-disciplinary discharge planning process, led by the attending physician.  Recommendations may be updated based on patient status, additional functional criteria and insurance authorization.  Follow Up Recommendations  No PT follow up     Assistance Recommended at Discharge PRN     Equipment Recommendations  None recommended by PT    Recommendations for Other Services       Precautions / Restrictions Precautions Precautions: Fall     Mobility  Bed Mobility Overal bed mobility: Modified Independent                  Transfers Overall transfer level: Modified independent                      Ambulation/Gait Ambulation/Gait assistance: Supervision Gait Distance (Feet): 500 Feet Assistive device: None Gait Pattern/deviations: Step-through pattern, Decreased stride length Gait velocity: reduced, variable     General Gait Details: steady gait   Stairs Stairs: Yes Stairs  assistance: Supervision Stair Management: No rails, Alternating pattern, Forwards, One rail Right Number of Stairs: 30 General stair comments: first floor good ascent without rail use, decreased foot clearance with second flight, cues for decreased velocity and use of rails, again with descent vc for use of at least one rail       Balance Overall balance assessment: Needs assistance               Single Leg Stance - Right Leg: 30 Single Leg Stance - Left Leg: 26           High Level Balance Comments: single leg stand with eyes closed 30+ sec on R and 18 sec L            Cognition Arousal/Alertness: Awake/alert Behavior During Therapy: WFL for tasks assessed/performed, Anxious Overall Cognitive Status: Impaired/Different from baseline Area of Impairment: Safety/judgement, Problem solving                         Safety/Judgement: Decreased awareness of safety   Problem Solving: Requires verbal cues, Requires tactile cues General Comments: requires increased cuing for stair navigation           General Comments General comments (skin integrity, edema, etc.): VSS on RA      Pertinent Vitals/Pain  No denies pain      PT Goals (current goals can now be found in the care plan section) Acute Rehab PT Goals Patient Stated Goal: move back to [place] PT Goal  Formulation: With patient Time For Goal Achievement: 09/17/21 Potential to Achieve Goals: Good Progress towards PT goals: Progressing toward goals    Frequency    Min 3X/week      PT Plan Discharge plan needs to be updated       AM-PAC PT "6 Clicks" Mobility   Outcome Measure  Help needed turning from your back to your side while in a flat bed without using bedrails?: None Help needed moving from lying on your back to sitting on the side of a flat bed without using bedrails?: None Help needed moving to and from a bed to a chair (including a wheelchair)?: None Help needed standing up from a  chair using your arms (e.g., wheelchair or bedside chair)?: None Help needed to walk in hospital room?: None Help needed climbing 3-5 steps with a railing? : A Little 6 Click Score: 23    End of Session   Activity Tolerance: Patient tolerated treatment well Patient left: in bed;with call bell/phone within reach Nurse Communication: Mobility status PT Visit Diagnosis: Unsteadiness on feet (R26.81);Muscle weakness (generalized) (M62.81);Difficulty in walking, not elsewhere classified (R26.2);Ataxic gait (R26.0);Other symptoms and signs involving the nervous system (G28.366)     Time: 2947-6546 PT Time Calculation (min) (ACUTE ONLY): 20 min  Charges:  $Gait Training: 8-22 mins                     Verland Sprinkle B. Beverely Risen PT, DPT Acute Rehabilitation Services Please use secure chat or  Call Office [phone]     Elon Alas [facility] 09/06/2021, 3:49 PM

## 2021-09-06 NOTE — Progress Notes (Signed)
  Progress Note   Patient: Gregg Obrien KYH:[id number] DOB: [birth year] DOA: 09/02/2021     3 DOS: the patient was seen and examined on 09/06/2021 at 9:50AM      Brief hospital course: [name] is a 41 y.o. M with ADD, anxiety, GERD who presented with several disparate neurological complaints of varying time frames including urinary bladder control, vision changes and more recently dizziness, jerky legs.   In the ER, MRI was obtained that was suspicious for MS.  Neurology were consulted.     Assessment and Plan: * Demyelinating changes in brain (HCC) Suspect MS. - Continue Solumedrol day 4 of 5 - PT eval - Consult Neuro   Anxiety disorder, unspecified - Continue Xanax, escitalopram  Gastroesophageal reflux disease - Continue PPI, increase dose  Adult ADHD - Hold Adderall  Abnormal TSH fT4 normal - Follow T3  Urinary frequency - Continue Silodosin - Ouptaitent Urology follow up is pending          Subjective: No new complaints today.  No new weakness, speech changes, focal numbness.   Physical Exam: Vitals:   09/05/21 2003 09/05/21 2318 09/06/21 0324 09/06/21 1152  BP: 122/69 127/77 117/77 120/75  Pulse: (!) 52 (!) 54 (!) 50 61  Resp: 19 17 17    Temp: 98 F (36.7 C) 97.7 F (36.5 C) 98.3 F (36.8 C) 98.4 F (36.9 C)  TempSrc: Oral  Oral Oral  SpO2: 96% 96% 96% 94%  Weight:      Height:       Adult male, lying in bed, interactive RRR, no murmurs, no peripheral edema Respiratory rate normal, lungs clear without rales or wheezes Attention normal, affect appropriate, judgment insight appear normal   Data Reviewed: Nursing notes reviewed, vital signs reviewed Labs notable for normal T4      Disposition: Status is: Inpatient Remains inpatient appropriate because: The patient is admitted with new diagnosis of multiple sclerosis, he will require another day of steroids, and then we will plan for discharge  home.        Author: , MD 09/06/2021 4:36 PM  For on call review [email or web address].

## 2021-09-07 DIAGNOSIS — G379 Demyelinating disease of central nervous system, unspecified: Secondary | ICD-10-CM | POA: Diagnosis not present

## 2021-09-07 DIAGNOSIS — K219 Gastro-esophageal reflux disease without esophagitis: Secondary | ICD-10-CM | POA: Diagnosis not present

## 2021-09-07 DIAGNOSIS — F909 Attention-deficit hyperactivity disorder, unspecified type: Secondary | ICD-10-CM | POA: Diagnosis not present

## 2021-09-07 LAB — GLUCOSE, CAPILLARY: Glucose-Capillary: 122 mg/dL — ABNORMAL HIGH (ref 70–99)

## 2021-09-07 LAB — METHYLMALONIC ACID, SERUM: Methylmalonic Acid, Quantitative: 62 nmol/L (ref 0–378)

## 2021-09-07 LAB — T3: T3, Total: 76 ng/dL (ref 71–180)

## 2021-09-07 NOTE — Discharge Summary (Signed)
Physician Discharge Summary   Patient: Gregg Obrien MRN: [id number] DOB: 11/28/1980  Admit date:     09/02/2021  Discharge date: 09/07/21  Discharge Physician: Alberteen Sam   PCP: Clayborne Dana, NP     Recommendations at discharge:  Follow up with Dr. Paschal Dopp Neurology as soon as able Follow up with [facility] in 3-4 weeks when moved to [facility] Defer timing of LP to outpatient Neurology [facility]: Please recheck TSH, fT4 in 3 months     Discharge Diagnoses: Principal Problem:   Demyelinating changes in brain [facility]) Active Problems:   Adult ADHD   Gastroesophageal reflux disease   Anxiety disorder, unspecified   Urinary frequency   Abnormal TSH       Hospital Course: Gregg Obrien is a 41 y.o. M with ADD, anxiety, GERD who presented with several disparate neurological complaints of varying time frames including urinary bladder control, vision changes and more recently dizziness, jerky legs.    In the ER, MRI was obtained that was suspicious for MS.  Neurology were consulted.     * Demyelinating changes in brain [facility]) Patient was admitted and Neurology were consulted.  Based on the temporal distribution of his deficits and the pattern of demyelination on MRI, MS was suspected.    He was treated with Solu-medrol 1g daily for 5 days.  He was discharged with close follow up with a local MS specialist for LP and further differential diagnosis.    He was referred to a Neurology specialist in [place], [place], where he will be moved by his parole officer in 2-4 weeks.         Adult ADHD Patient takes Adderall.  Anxiety disorder, unspecified Patient takes Xanax and escitalopram  Abnormal TSH Incidentally low TSH.  T3 and fT4 normal, suspect this is clinically insignificant.  Recommend repeat testing in 3-4 months. - Follow T3              The [facility] Controlled Substances Registry was reviewed for this patient prior to  discharge.   Consultants: Neurology Procedures performed: MRI brain  Disposition: Home   DISCHARGE MEDICATION: Allergies as of 09/07/2021   No Known Allergies      Medication List     TAKE these medications    ALPRAZolam 1 MG tablet Commonly known as: XANAX Take 1 mg by mouth 4 (four) times daily as needed for anxiety.   amphetamine-dextroamphetamine 30 MG 24 hr capsule Commonly known as: ADDERALL XR Take 30 mg by mouth every morning.   amphetamine-dextroamphetamine 30 MG tablet Commonly known as: ADDERALL Take 30 mg by mouth PC lunch.   escitalopram 10 MG tablet Commonly known as: LEXAPRO Take 10 mg by mouth daily.   lansoprazole 30 MG capsule Commonly known as: PREVACID Take 30 mg by mouth daily.        Follow-up Information     Sater, Pearletha Furl, MD Follow up.   Specialty: Neurology Contact information: [address] [place] [address] [phone]         [facility] Follow up.   Why: We have sent a referral.  They should reach out in 2-3 weeks.  If they do not, their phone number is [phone] Contact information: [address] [place] [facility] Second Floor [place], [place] [address]        [facility]. Schedule an appointment as soon as possible for a visit in 1 week(s).  Specialty: Rehabilitation Contact information: [address] [facility] [id number] mc [place] [address] [phone]                Discharge Instructions     Ambulatory referral to Neurology   Complete by: As directed    An appointment is requested in approximately: 1-2 weeks For new MS   Ambulatory referral to Occupational Therapy   Complete by: As directed    Discharge instructions   Complete by: As directed    You were admitted for new neurological symptoms that we believe are likely from multiple sclerosis.  We have sent a referral to Dr.  Despina Arias, at [facility] for an appointment.  They will contact you for the timing of that appointment.  I have also sent a referral to the [facility] in [place], [place].  They will contact you in the next 2-3 weeks for an appointment   At this time, you may drive.  Our Neurologist, physical therapist and I do not see a reason to limit your physical activity at this time.  Discuss with your employer if they have activity restrictions for you.  If you are having trouble with the referrals, my office number is [phone], call before June 9th.   Increase activity slowly   Complete by: As directed        Discharge Exam: Filed Weights   09/02/21 1540 09/04/21 0500  Weight: 95.3 kg 95.3 kg    General: Pt is alert, awake, not in acute distress Cardiovascular: RRR, nl S1-S2, no murmurs appreciated.   No LE edema.   Respiratory: Normal respiratory rate and rhythm.  CTAB without rales or wheezes. Abdominal: Abdomen soft and non-tender.  No distension or HSM.   Neuro/Psych: Strength symmetric in upper and lower extremities.  Judgment and insight appear normal.   Condition at discharge: good  The results of significant diagnostics from this hospitalization (including imaging, microbiology, ancillary and laboratory) are listed below for reference.   Imaging Studies: CT ANGIO HEAD NECK W WO CM  Result Date: 09/02/2021 CLINICAL DATA:  Initial evaluation for focal neural deficit, paresthesias. EXAM: CT ANGIOGRAPHY HEAD AND NECK TECHNIQUE: Multidetector CT imaging of the head and neck was performed using the standard protocol during bolus administration of intravenous contrast. Multiplanar CT image reconstructions and MIPs were obtained to evaluate the vascular anatomy. Carotid stenosis measurements (when applicable) are obtained utilizing NASCET criteria, using the distal internal carotid diameter as the denominator. RADIATION DOSE  REDUCTION: This exam was performed according to the departmental dose-optimization program which includes automated exposure control, adjustment of the mA and/or kV according to patient size and/or use of iterative reconstruction technique. CONTRAST:  46mL OMNIPAQUE IOHEXOL 350 MG/ML SOLN COMPARISON:  Head CT from earlier the same day. FINDINGS: CTA NECK FINDINGS Aortic arch: Visualized aortic arch normal caliber with normal branch pattern. No stenosis about the origin of the great vessels. Right carotid system: Right common and internal carotid arteries widely patent without stenosis, dissection or occlusion. Left carotid system: Left common and internal carotid arteries widely patent without stenosis, dissection or occlusion. Vertebral arteries: Both vertebral arteries arise from the subclavian arteries. No proximal subclavian artery stenosis. Both vertebral arteries widely patent without stenosis, dissection or occlusion. Skeleton: No discrete or worrisome osseous lesions. Other neck: No other acute soft tissue abnormality within the neck. Upper chest: Visualized upper chest demonstrates no acute finding. Review of the MIP images confirms the above findings CTA HEAD FINDINGS  Anterior circulation: Both internal carotid arteries widely patent to the termini without stenosis. A1 segments widely patent. Normal anterior communicating artery complex. Both anterior cerebral arteries widely patent to their distal aspects without stenosis. No M1 stenosis or occlusion. Normal MCA bifurcations. Distal MCA branches well perfused and symmetric. Posterior circulation: Vertebral arteries are diminutive but patent without stenosis. Both PICA patent. Diminutive basilar artery widely patent to its distal aspect. Superior cerebellar arteries patent bilaterally. Fetal type origin of the PCAs. Both PCAs well perfused or distal aspects. Venous sinuses: Patent noted Anatomic variants: Fetal type origin of the PCAs with overall  diminutive vertebrobasilar system. Review of the MIP images confirms the above findings IMPRESSION: 1. Negative CTA of the head and neck. No large vessel occlusion, hemodynamically significant stenosis, or other acute vascular abnormality. 2. Fetal type origin of the PCAs with overall diminutive vertebrobasilar system. Electronically Signed   By: Rise Mu M.D.   On: 09/02/2021 23:24   CT Head Wo Contrast  Result Date: 09/02/2021 CLINICAL DATA:  Altered mental status EXAM: CT HEAD WITHOUT CONTRAST TECHNIQUE: Contiguous axial images were obtained from the base of the skull through the vertex without intravenous contrast. RADIATION DOSE REDUCTION: This exam was performed according to the departmental dose-optimization program which includes automated exposure control, adjustment of the mA and/or kV according to patient size and/or use of iterative reconstruction technique. COMPARISON:  None Available. FINDINGS: Brain: No acute intracranial hemorrhage, mass effect, or herniation. No extra-axial fluid collections. No evidence of acute territorial infarct. No hydrocephalus. Approximately 13 mm focal hypodensity in the left parietal white matter. Vascular: No hyperdense vessel or unexpected calcification. Skull: Normal. Negative for fracture or focal lesion. Sinuses/Orbits: No acute finding. Other: None. IMPRESSION: 1. No acute intracranial hemorrhage or mass effect. 2. Small focal hypodensity in the left parietal white matter which is indeterminate, possibly a lacunar infarct of indeterminate age. Correlate clinically and consider MRI if indicated. Electronically Signed   By: Jannifer Hick M.D.   On: 09/02/2021 16:41   CT Cervical Spine Wo Contrast  Result Date: 09/02/2021 CLINICAL DATA:  Altered mental status, numbness EXAM: CT CERVICAL SPINE WITHOUT CONTRAST TECHNIQUE: Multidetector CT imaging of the cervical spine was performed without intravenous contrast. Multiplanar CT image reconstructions  were also generated. RADIATION DOSE REDUCTION: This exam was performed according to the departmental dose-optimization program which includes automated exposure control, adjustment of the mA and/or kV according to patient size and/or use of iterative reconstruction technique. COMPARISON:  None Available. FINDINGS: Alignment: Normal. Skull base and vertebrae: No acute fracture. No primary bone lesion or focal pathologic process. Soft tissues and spinal canal: No prevertebral fluid or swelling. No visible canal hematoma. Disc levels:  Intervertebral disc spaces are preserved. Upper chest: No acute process. Other: None. IMPRESSION: No acute fracture or malalignment identified in the cervical spine. Electronically Signed   By: Jannifer Hick M.D.   On: 09/02/2021 16:43   MR BRAIN WO CONTRAST  Result Date: 09/02/2021 CLINICAL DATA:  Initial evaluation for neuro deficit, stroke suspected. EXAM: MRI HEAD WITHOUT CONTRAST TECHNIQUE: Multiplanar, multiecho pulse sequences of the brain and surrounding structures were obtained without intravenous contrast. COMPARISON:  CT from earlier the same day. FINDINGS: Brain: Cerebral volume within normal limits. Scattered multifocal foci of T2/FLAIR signal abnormality seen involving the periventricular, deep, and juxta cortical white matter of both cerebral hemispheres. Several of these foci oriented perpendicular to the lateral ventricles. Involvement of the right thalamic capsular region. Multiple corresponding T1 black holes. Appearance is  most concerning for demyelinating disease. No associated diffusion abnormality to suggest active demyelination on this noncontrast examination. No evidence for acute or subacute infarct. Gray-white matter differentiation otherwise maintained. No acute or chronic intracranial blood products. No mass lesion or midline shift. No hydrocephalus or extra-axial fluid collection. Pituitary gland suprasellar region normal. Vascular: Major  intracranial vascular flow voids are maintained. Skull and upper cervical spine: Craniocervical junction normal. Bone marrow signal intensity within normal limits. No scalp soft tissue abnormality. Sinuses/Orbits: Globes and orbital soft tissues within normal limits. Paranasal sinuses are clear. No mastoid effusion. Other: None. IMPRESSION: 1. Scattered multifocal T2/FLAIR signal abnormality involving the supratentorial cerebral white matter, most concerning for demyelinating disease. No associated diffusion abnormality to suggest active demyelination on this noncontrast examination. 2. No other acute intracranial abnormality. Electronically Signed   By: Rise Mu M.D.   On: 09/02/2021 22:49   MR BRAIN W CONTRAST  Result Date: 09/03/2021 CLINICAL DATA:  Demyelination on brain MRI EXAM: MRI HEAD WITH CONTRAST TECHNIQUE: Multiplanar, multiecho pulse sequences of the brain and surrounding structures were obtained with intravenous contrast. CONTRAST:  63mL GADAVIST GADOBUTROL 1 MMOL/ML IV SOLN COMPARISON:  Noncontrast brain MRI from yesterday FINDINGS: Brain: Intermittently motion degraded postcontrast imaging. Plaque-like findings in the brain with pattern commonly seen in multiple sclerosis. Left periatrial white matter and right posterior/inferior frontal juxtacortical plaques are enhancing on axial initial thin section sequence. The juxtacortical plaque has a peripheral/leading edge pattern of enhancement. Two small nodular areas of enhancement are seen or on coronal and sagittal postcontrast imaging in juxtacortical left parietal cortex and right corona radiata. On sagittal postcontrast imaging a ventral and superior pontine plaque is also seen to enhance. Vascular: Major vascular enhancements are preserved. Skull and upper cervical spine: Normal marrow signal. Sinuses/Orbits: Negative IMPRESSION: Demyelinating pattern with 5 enhancing plaques that are consistent with active disease. Electronically  Signed   By: Tiburcio Pea M.D.   On: 09/03/2021 04:17   MR CERVICAL SPINE W WO CONTRAST  Result Date: 09/03/2021 CLINICAL DATA:  Demyelinating disease suggested on prior brain MRI. EXAM: MRI CERVICAL AND THORACIC SPINE WITHOUT AND WITH CONTRAST TECHNIQUE: Multiplanar and multiecho pulse sequences of the cervical spine, to include the craniocervical junction and cervicothoracic junction, and the thoracic spine, were obtained without and with intravenous contrast. CONTRAST:  55mL GADAVIST GADOBUTROL 1 MMOL/ML IV SOLN COMPARISON:  None Available. FINDINGS: MRI CERVICAL SPINE FINDINGS Alignment: Normal Vertebrae: No fracture, evidence of discitis, or bone lesion. Cord: Short segment T2 hyperintensities in the left cord at C7 and T1, best seen on STIR and axial gradient images. No associated swelling or enhancement. Longitudinal thin STIR hyperintensity throughout the cervical cord on sagittal imaging is non pathologic based on the other sequences. Posterior Fossa, vertebral arteries, paraspinal tissues: Medullary T2 hyperintensity which is right para median. Disc levels: No significant degenerative change or neural impingement. MRI THORACIC SPINE FINDINGS Alignment:  Physiologic. Vertebrae: No fracture, evidence of discitis, or bone lesion. Cord:  Short segment T2 hyperintensity in the left hemi cord at T1. Paraspinal and other soft tissues: Negative Disc levels: Mild disc desiccation and narrowing at T6-7 and T11-12 with small right foraminal herniation at T11-12. IMPRESSION: Short segment T2 hyperintensities in the left cord at C7 and T1, as seen with multiple sclerosis. Both are nonenhancing. Electronically Signed   By: Tiburcio Pea M.D.   On: 09/03/2021 04:11   MR THORACIC SPINE W WO CONTRAST  Result Date: 09/03/2021 CLINICAL DATA:  Demyelinating disease suggested on prior  brain MRI. EXAM: MRI CERVICAL AND THORACIC SPINE WITHOUT AND WITH CONTRAST TECHNIQUE: Multiplanar and multiecho pulse sequences of  the cervical spine, to include the craniocervical junction and cervicothoracic junction, and the thoracic spine, were obtained without and with intravenous contrast. CONTRAST:  10mL GADAVIST GADOBUTROL 1 MMOL/ML IV SOLN COMPARISON:  None Available. FINDINGS: MRI CERVICAL SPINE FINDINGS Alignment: Normal Vertebrae: No fracture, evidence of discitis, or bone lesion. Cord: Short segment T2 hyperintensities in the left cord at C7 and T1, best seen on STIR and axial gradient images. No associated swelling or enhancement. Longitudinal thin STIR hyperintensity throughout the cervical cord on sagittal imaging is non pathologic based on the other sequences. Posterior Fossa, vertebral arteries, paraspinal tissues: Medullary T2 hyperintensity which is right para median. Disc levels: No significant degenerative change or neural impingement. MRI THORACIC SPINE FINDINGS Alignment:  Physiologic. Vertebrae: No fracture, evidence of discitis, or bone lesion. Cord:  Short segment T2 hyperintensity in the left hemi cord at T1. Paraspinal and other soft tissues: Negative Disc levels: Mild disc desiccation and narrowing at T6-7 and T11-12 with small right foraminal herniation at T11-12. IMPRESSION: Short segment T2 hyperintensities in the left cord at C7 and T1, as seen with multiple sclerosis. Both are nonenhancing. Electronically Signed   By: Tiburcio Pea M.D.   On: 09/03/2021 04:11    Microbiology: No results found for this or any previous visit.  Labs: CBC: Recent Labs  Lab 09/02/21 1637 09/04/21 0157 09/05/21 0314 09/06/21 0402  WBC 7.3 12.3* 14.0* 14.5*  HGB 14.5 13.6 13.2 12.9*  HCT 42.4 40.1 39.7 39.0  MCV 88.3 88.9 90.4 90.5  PLT 293 282 291 287   Basic Metabolic Panel: Recent Labs  Lab 09/02/21 1637 09/05/21 0314  NA 137 137  K 3.5 4.1  CL 107 104  CO2 24 26  GLUCOSE 120* 132*  BUN 12 13  CREATININE 0.76 0.72  CALCIUM 8.9 9.2  MG  --  1.9  PHOS  --  3.7   Liver Function Tests: Recent Labs   Lab 09/05/21 0314  AST 15  ALT 19  ALKPHOS 78  BILITOT 0.2*  PROT 6.5  ALBUMIN 3.6   CBG: Recent Labs  Lab 09/06/21 0607 09/06/21 1248 09/06/21 1636 09/06/21 2144 09/07/21 0644  GLUCAP 118* 191* 239* 203* 122*    Discharge time spent: approximately 40 minutes spent on discharge counseling, evaluation of patient on day of discharge, and coordination of discharge planning with nursing, social work, pharmacy and case management  Signed: Alberteen Sam, MD [facility] 09/07/2021

## 2021-09-07 NOTE — Progress Notes (Signed)
D/c instructions given... pt verb understanding... being wheel chaired to d/c lounge.

## 2021-09-07 NOTE — TOC Transition Note (Signed)
Transition of Care [facility]) - CM/SW Discharge Note   Patient Details  Name: Gregg Obrien MRN: [id number] Date of Birth: Aug 03, 1980  Transition of Care [facility]) CM/SW Contact:  Kermit Balo, RN Phone Number: 09/07/2021, 10:11 AM   Clinical Narrative:    Patient discharging home with outpatient therapy through [facility] for OT. Information on the AVS.  Pt has transportation home.    Final next level of care: OP Rehab Barriers to Discharge: No Barriers Identified   Patient Goals and CMS Choice     Choice offered to / list presented to : Patient  Discharge Placement                       Discharge Plan and Services   Discharge Planning Services: CM Consult                                 Social Determinants of Health (SDOH) Interventions     Readmission Risk Interventions     View : No data to display.

## 2021-09-07 NOTE — Progress Notes (Signed)
Pt being d/c.  PIVs removed Pt on the phone w/Medicaid in [place] and unable to give d/c instructions... notified him to call me when he is finished so we can d/c

## 2021-09-07 NOTE — Progress Notes (Signed)
Neurology Progress Note   S:// Patient states he is doing better and still nervous about his multiple symptoms returning. He mentioned seeing larger teeth on people prior to his admission. That vision has subsided and his diplopia has improved. He is now able to watch TV and answer emails on his phone.  He denies any headaches , dizziness, syncope , paresthesia or new weakness   O:// Current vital signs: BP 134/82 (BP Location: Left Arm)   Pulse 68   Temp 97.8 F (36.6 C) (Oral)   Resp 20   Ht 6' (1.829 m)   Wt 95.3 kg   SpO2 97%   BMI 28.49 kg/m  Vital signs in last 24 hours: Temp:  [97.7 F (36.5 C)-98.4 F (36.9 C)] 97.8 F (36.6 C) (05/19 9604) Pulse Rate:  [55-68] 68 (05/19 0812) Resp:  [16-20] 20 (05/19 0812) BP: (120-135)/(69-86) 134/82 (05/19 0812) SpO2:  [94 %-97 %] 97 % (05/19 0812)  GENERAL: Awake, alert in NAD HEENT: - Normocephalic and atraumatic, dry mm, no LN++, no Thyromegally LUNGS - Clear to auscultation bilaterally with no wheezes CV - S1S2 RRR, no m/r/g, equal pulses bilaterally. ABDOMEN - Soft, nontender, nondistended with normoactive BS Ext: warm, well perfused, intact peripheral pulses, No edema  NEURO:  Mental Status: AA&Ox 4  Language: speech is clear with  Naming, repetition, fluency, and comprehension intact. Cranial Nerves: PERRL 3 mm to 2 mm/brisk. EOMI,  2 beat nystagmus , visual fields full, no facial asymmetry, facial sensation intact, hearing intact, tongue/uvula/soft palate midline, normal sternocleidomastoid and trapezius muscle strength.Tongue midline Motor: RUE 5/5  RLE 5/5 Right Grip 5/5             LUE 5/5  LLE hip flexor 4/5  Tone: is normal and bulk is normal Sensation- Intact to light touch bilaterally Coordination: FTN intact bilaterally, no ataxia in BLE. Gait- deferred   Medications  Current Facility-Administered Medications:    acetaminophen (TYLENOL) tablet 650 mg, 650 mg, Oral, Q6H PRN, 650 mg at 09/03/21 1232 **OR**  [DISCONTINUED] acetaminophen (TYLENOL) suppository 650 mg, 650 mg, Rectal, Q6H PRN, Carollee Herter, DO   ALPRAZolam Prudy Feeler) tablet 1 mg, 1 mg, Oral, QID PRN, Carollee Herter, DO, 1 mg at 09/07/21 0701   alum & mag hydroxide-simeth (MAALOX/MYLANTA) 200-200-20 MG/5ML suspension 15-30 mL, 15-30 mL, Oral, Q4H PRN, Lonia Blood, MD, 30 mL at 09/06/21 2141   docusate sodium (COLACE) capsule 100 mg, 100 mg, Oral, BID, Carollee Herter, DO, 100 mg at 09/06/21 2103   escitalopram (LEXAPRO) tablet 10 mg, 10 mg, Oral, Daily, Carollee Herter, DO, 10 mg at 09/06/21 0944   insulin aspart (novoLOG) injection 0-9 Units, 0-9 Units, Subcutaneous, TID WC, Lonia Blood, MD, 1 Units at 09/07/21 0701   melatonin tablet 5 mg, 5 mg, Oral, QHS, Lonia Blood, MD, 5 mg at 09/06/21 2103   ondansetron (ZOFRAN) tablet 4 mg, 4 mg, Oral, Q6H PRN **OR** ondansetron (ZOFRAN) injection 4 mg, 4 mg, Intravenous, Q6H PRN, Carollee Herter, DO   pantoprazole (PROTONIX) EC tablet 80 mg, 80 mg, Oral, Daily, [place], Earl Lites, MD   senna (SENOKOT) tablet 8.6 mg, 1 tablet, Oral, BID, Carollee Herter, DO, 8.6 mg at 09/06/21 0945   silodosin (RAPAFLO) capsule 8 mg, 8 mg, Oral, Daily, Carollee Herter, DO, 8 mg at 09/06/21 0945   vitamin B-12 (CYANOCOBALAMIN) tablet 1,000 mcg, 1,000 mcg, Oral, Daily, Carollee Herter, DO, 1,000 mcg at 09/06/21 0944  Labs CBC    Component Value Date/Time  WBC 14.5 (H) 09/06/2021 0402   RBC 4.31 09/06/2021 0402   HGB 12.9 (L) 09/06/2021 0402   HCT 39.0 09/06/2021 0402   PLT 287 09/06/2021 0402   MCV 90.5 09/06/2021 0402   MCH 29.9 09/06/2021 0402   MCHC 33.1 09/06/2021 0402   RDW 13.2 09/06/2021 0402    CMP     Component Value Date/Time   NA 137 09/05/2021 0314   K 4.1 09/05/2021 0314   CL 104 09/05/2021 0314   CO2 26 09/05/2021 0314   GLUCOSE 132 (H) 09/05/2021 0314   BUN 13 09/05/2021 0314   CREATININE 0.72 09/05/2021 0314   CALCIUM 9.2 09/05/2021 0314   PROT 6.5 09/05/2021 0314   ALBUMIN 3.6 09/05/2021  0314   AST 15 09/05/2021 0314   ALT 19 09/05/2021 0314   ALKPHOS 78 09/05/2021 0314   BILITOT 0.2 (L) 09/05/2021 0314   GFRNONAA >60 09/05/2021 0314    glycosylated hemoglobin  Lipid Panel  No results found for: CHOL, TRIG, HDL, CHOLHDL, VLDL, LDLCALC, LDLDIRECT   Imaging I have reviewed images in epic and the results pertinent to this consultation are:  MRI brain, cervical spine, thoracic spine: Brain images with demyelinating pattern including 5 enhancing plaques that are consistent with active disease. Also with notable plaques at C7, T1, T2 which do not enhance.   Assessment: 41 yo right handed male with past medical history for depression, anxiety and OSA presented to [facility] , c/o ataxia,light headed for several weeks,urinary incontinence and lower extremity jerking movements.   Impression: Most likely Multiple Sclerosis since lesions/ clinical symptoms separated temporally and spatially. ADEM unlikely as it tends to be monophasic and the lesions on imaging are more characteristic of MS.   Recommendations: Neuro Checks q 4 hours Completing 5 day course of IV Steroids (Solumedrol) Continue B12 supplementation Will need LP for diagnosis confirmation as out patient He will need referral to Outpatient Neurology Dr Epimenio Foot  and will need follow up of anti-mog, AB's and NMO We will sign off @ this time   -- Camillo Flaming , PA-C Neurohospitalist APP [facility]  Electronically signed: Dr. Caryl Pina

## 2021-09-18 NOTE — Progress Notes (Unsigned)
[facility]  PATIENT: Gregg Obrien DOB: 31-Jul-1980  REFERRING DOCTOR OR PCP: Joen Laura MD; Hyman Hopes, NP (PCP) SOURCE: Patient, notes from recent hospitalization, imaging and lab reports, multiple MRI images personally reviewed.  _________________________________   HISTORICAL  CHIEF COMPLAINT:  No chief complaint on file.   HISTORY OF PRESENT ILLNESS:  ***  Imaging review: MRI of the brain 09/02/2021 shows multiple T2/FLAIR hyperintense foci in the periventricular, juxtacortical and deep white matter consistent with demyelination.  Several of the foci enhance including juxtacortical foci on the right posterior frontal lobe and left parietal lobe in the periventricular focus on the left.  MRI of the cervical and thoracic spine 09/03/2021 show T2/FLAIR hyperintense foci to the left adjacent to C7 and adjacent to T1-T2.  These do not enhance.  No significant degenerative changes.  Labs: 08/2021: NMO antibody negative, HIV negative, RPR negative hemoglobin A1c equals 5.7,  REVIEW OF SYSTEMS: Constitutional: No fevers, chills, sweats, or change in appetite Eyes: No visual changes, double vision, eye pain Ear, nose and throat: No hearing loss, ear pain, nasal congestion, sore throat Cardiovascular: No chest pain, palpitations Respiratory:  No shortness of breath at rest or with exertion.   No wheezes GastrointestinaI: No nausea, vomiting, diarrhea, abdominal pain, fecal incontinence Genitourinary:  No dysuria, urinary retention or frequency.  No nocturia. Musculoskeletal:  No neck pain, back pain Integumentary: No rash, pruritus, skin lesions Neurological: as above Psychiatric: No depression at this time.  No anxiety Endocrine: No palpitations, diaphoresis, change in appetite, change in weigh or increased thirst Hematologic/Lymphatic:  No anemia, purpura, petechiae. Allergic/Immunologic: No itchy/runny eyes, nasal congestion, recent allergic  reactions, rashes  ALLERGIES: No Known Allergies  HOME MEDICATIONS:  Current Outpatient Medications:    ALPRAZolam (XANAX) 1 MG tablet, Take 1 mg by mouth 4 (four) times daily as needed for anxiety., Disp: , Rfl:    amphetamine-dextroamphetamine (ADDERALL XR) 30 MG 24 hr capsule, Take 30 mg by mouth every morning., Disp: , Rfl:    amphetamine-dextroamphetamine (ADDERALL) 30 MG tablet, Take 30 mg by mouth PC lunch., Disp: , Rfl:    escitalopram (LEXAPRO) 10 MG tablet, Take 10 mg by mouth daily., Disp: , Rfl:    lansoprazole (PREVACID) 30 MG capsule, Take 30 mg by mouth daily., Disp: , Rfl:   PAST MEDICAL HISTORY: Past Medical History:  Diagnosis Date   Anxiety    Depression    GERD (gastroesophageal reflux disease)    Sleep apnea     PAST SURGICAL HISTORY: Past Surgical History:  Procedure Laterality Date   HERNIA REPAIR      FAMILY HISTORY: Family History  Problem Relation Age of Onset   COPD Mother    Cancer Mother    Anxiety disorder Mother    ADD / ADHD Mother    Heart disease Father    Depression Sister    Anxiety disorder Sister    ADD / ADHD Sister     SOCIAL HISTORY:  Social History   Socioeconomic History   Marital status: Single    Spouse name: Not on file   Number of children: Not on file   Years of education: Not on file   Highest education level: Not on file  Occupational History   Not on file  Tobacco Use   Smoking status: Former    Types: Cigarettes   Smokeless tobacco: Not on file  Substance and Sexual Activity   Alcohol use: Never   Drug use: Never   Sexual activity:  Not Currently  Other Topics Concern   Not on file  Social History Narrative   Not on file   Social Determinants of Health   Financial Resource Strain: Not on file  Food Insecurity: Not on file  Transportation Needs: Not on file  Physical Activity: Not on file  Stress: Not on file  Social Connections: Not on file  Intimate Partner Violence: Not on file      PHYSICAL EXAM  There were no vitals filed for this visit.  There is no height or weight on file to calculate BMI.   General: The patient is well-developed and well-nourished and in no acute distress  HEENT:  Head is [place]/AT.  Sclera are anicteric.  Funduscopic exam shows normal optic discs and retinal vessels.  Neck: No carotid bruits are noted.  The neck is nontender.  Cardiovascular: The heart has a regular rate and rhythm with a normal S1 and S2. There were no murmurs, gallops or rubs.    Skin: Extremities are without rash or  edema.  Musculoskeletal:  Back is nontender  Neurologic Exam  Mental status: The patient is alert and oriented x 3 at the time of the examination. The patient has apparent normal recent and remote memory, with an apparently normal attention span and concentration ability.   Speech is normal.  Cranial nerves: Extraocular movements are full. Pupils are equal, round, and reactive to light and accomodation.  Visual fields are full.  Facial symmetry is present. There is good facial sensation to soft touch bilaterally.Facial strength is normal.  Trapezius and sternocleidomastoid strength is normal. No dysarthria is noted.  The tongue is midline, and the patient has symmetric elevation of the soft palate. No obvious hearing deficits are noted.  Motor:  Muscle bulk is normal.   Tone is normal. Strength is  5 / 5 in all 4 extremities.   Sensory: Sensory testing is intact to pinprick, soft touch and vibration sensation in all 4 extremities.  Coordination: Cerebellar testing reveals good finger-nose-finger and heel-to-shin bilaterally.  Gait and station: Station is normal.   Gait is normal. Tandem gait is normal. Romberg is negative.   Reflexes: Deep tendon reflexes are symmetric and normal bilaterally.   Plantar responses are flexor.    DIAGNOSTIC DATA (LABS, IMAGING, TESTING) - I reviewed patient records, labs, notes, testing and imaging myself where  available.  Lab Results  Component Value Date   WBC 14.5 (H) 09/06/2021   HGB 12.9 (L) 09/06/2021   HCT 39.0 09/06/2021   MCV 90.5 09/06/2021   PLT 287 09/06/2021      Component Value Date/Time   NA 137 09/05/2021 0314   K 4.1 09/05/2021 0314   CL 104 09/05/2021 0314   CO2 26 09/05/2021 0314   GLUCOSE 132 (H) 09/05/2021 0314   BUN 13 09/05/2021 0314   CREATININE 0.72 09/05/2021 0314   CALCIUM 9.2 09/05/2021 0314   PROT 6.5 09/05/2021 0314   ALBUMIN 3.6 09/05/2021 0314   AST 15 09/05/2021 0314   ALT 19 09/05/2021 0314   ALKPHOS 78 09/05/2021 0314   BILITOT 0.2 (L) 09/05/2021 0314   GFRNONAA >60 09/05/2021 0314   No results found for: CHOL, HDL, LDLCALC, LDLDIRECT, TRIG, CHOLHDL Lab Results  Component Value Date   HGBA1C 5.7 (H) 09/04/2021   Lab Results  Component Value Date   VITAMINB12 310 09/03/2021   Lab Results  Component Value Date   TSH 0.109 (L) 09/05/2021       ASSESSMENT AND PLAN  ***  Shakesha Soltau A. Epimenio Foot, MD, [facility] 09/18/2021, 6:25 PM Certified in Neurology, Clinical Neurophysiology, Sleep Medicine and Neuroimaging  [facility] [address], [address] [place], [place] [address] [phone]

## 2021-09-20 ENCOUNTER — Telehealth: Payer: Self-pay | Admitting: Neurology

## 2021-09-20 ENCOUNTER — Ambulatory Visit: Payer: 59 | Admitting: Neurology

## 2021-09-20 ENCOUNTER — Encounter: Payer: Self-pay | Admitting: Neurology

## 2021-09-20 ENCOUNTER — Other Ambulatory Visit: Payer: Self-pay | Admitting: Neurology

## 2021-09-20 VITALS — BP 114/78 | HR 88 | Ht 72.0 in | Wt 213.5 lb

## 2021-09-20 DIAGNOSIS — G35 Multiple sclerosis: Secondary | ICD-10-CM

## 2021-09-20 DIAGNOSIS — R4189 Other symptoms and signs involving cognitive functions and awareness: Secondary | ICD-10-CM

## 2021-09-20 DIAGNOSIS — Z8669 Personal history of other diseases of the nervous system and sense organs: Secondary | ICD-10-CM

## 2021-09-20 DIAGNOSIS — H539 Unspecified visual disturbance: Secondary | ICD-10-CM | POA: Diagnosis not present

## 2021-09-20 DIAGNOSIS — R269 Unspecified abnormalities of gait and mobility: Secondary | ICD-10-CM

## 2021-09-20 MED ORDER — TECFIDERA 120 & 240 MG PO MISC: ORAL | 0 refills | Status: DC

## 2021-09-20 MED ORDER — TRAZODONE HCL 50 MG PO TABS: 50.0000 mg | ORAL_TABLET | Freq: Every day | ORAL | 11 refills | Status: DC

## 2021-09-20 MED ORDER — DIMETHYL FUMARATE 240 MG PO CPDR: DELAYED_RELEASE_CAPSULE | ORAL | 11 refills | Status: AC

## 2021-09-20 MED ORDER — DIMETHYL FUMARATE 240 MG PO CPDR
DELAYED_RELEASE_CAPSULE | ORAL | 11 refills | Status: DC
Start: 2021-09-20 — End: 2021-09-20

## 2021-09-20 NOTE — Telephone Encounter (Signed)
Called the patient and advised that we got his message. Advised that this particular medication does have to come from a specialty pharmacy. Advised that we will have to contact the insurance to find what specialty pharmacy to send the medication to. Advised also it is likely we will have to complete a prior auth for the medication instructed that we will get started on working on this process and once we get it all taken care of the specialty pharmacy will be in touch with him.   ** If pt returns call please reiterate above. We have to find out what his insurance preferred specialty pharmacy is as well as completed a prior auth for the medication for the patient.

## 2021-09-20 NOTE — Telephone Encounter (Signed)
I have forwarded the medication refills to [facility] as it appears they are contracted with his insurance.  I have also went ahead and attempted to complete a PA for the medication and the response I received was, "[employer] [employer] [facility] Prior Authorization Team is unable to review this request for prior authorization as the requested medication is on formulary and does not require a prior authorization. No further action is needed at this time."  As long as [facility] is ok to get the medication for the patient, they should be able to fill for the patient without needing a auth

## 2021-09-20 NOTE — Telephone Encounter (Signed)
Pt said [facility] said do not carry Dimethyl Fumarate (TECFIDERA) 120 & 240 MG MISC. Would like a call from the nurse to discuss what to do.

## 2021-09-21 ENCOUNTER — Encounter: Payer: Self-pay | Admitting: Urology

## 2021-09-21 ENCOUNTER — Ambulatory Visit: Payer: 59 | Admitting: Urology

## 2021-09-21 ENCOUNTER — Encounter: Payer: Self-pay | Admitting: Neurology

## 2021-09-21 VITALS — BP 120/76 | HR 71 | Ht 72.0 in | Wt 210.0 lb

## 2021-09-21 DIAGNOSIS — N3941 Urge incontinence: Secondary | ICD-10-CM

## 2021-09-21 DIAGNOSIS — R3915 Urgency of urination: Secondary | ICD-10-CM | POA: Diagnosis not present

## 2021-09-21 DIAGNOSIS — R350 Frequency of micturition: Secondary | ICD-10-CM

## 2021-09-21 LAB — URINALYSIS, COMPLETE
Bilirubin, UA: NEGATIVE
Glucose, UA: NEGATIVE
Ketones, UA: NEGATIVE
Nitrite, UA: NEGATIVE
Protein,UA: NEGATIVE
RBC, UA: NEGATIVE
Specific Gravity, UA: 1.03 (ref 1.005–1.030)
Urobilinogen, Ur: 0.2 mg/dL (ref 0.2–1.0)
pH, UA: 5.5 (ref 5.0–7.5)

## 2021-09-21 LAB — BLADDER SCAN AMB NON-IMAGING: Scan Result: 0

## 2021-09-21 LAB — MICROSCOPIC EXAMINATION

## 2021-09-21 MED ORDER — MIRABEGRON ER 50 MG PO TB24: 50.0000 mg | ORAL_TABLET | Freq: Every day | ORAL | 0 refills | Status: DC

## 2021-09-21 NOTE — Progress Notes (Signed)
09/21/2021 2:01 PM   Gregg Obrien January 08, 1981 [id number]  Referring provider: Clayborne Dana, NP [address] [place],  [place] [address]  Chief Complaint  Patient presents with   Urinary Frequency    HPI: Gregg Obrien is a 41 y.o. male referred for evaluation of lower urinary tract symptoms.  At least a 10-year history of bothersome lower urinary tract symptoms consisting of urinary frequency, urgency with urge incontinence and sensation of incomplete bladder emptying. He has been on an alpha-blockers in the past including tamsulosin and silodosin which did not improve his symptoms IPSS today 24/35 with most bothersome symptoms urinary frequency and a weak urinary stream Recent MRI with demyelinization.  He was seen by neurology and meets criteria for MS Denies dysuria, gross hematuria No flank, abdominal or pelvic pain   PMH: Past Medical History:  Diagnosis Date   Anxiety    Depression    GERD (gastroesophageal reflux disease)    Sleep apnea     Surgical History: Past Surgical History:  Procedure Laterality Date   HERNIA REPAIR      Home Medications:  Allergies as of 09/21/2021   No Known Allergies      Medication List        Accurate as of September 21, 2021  2:01 PM. If you have any questions, ask your nurse or doctor.          ALPRAZolam 1 MG tablet Commonly known as: XANAX Take 1 mg by mouth 4 (four) times daily as needed for anxiety.   amphetamine-dextroamphetamine 30 MG 24 hr capsule Commonly known as: ADDERALL XR Take 30 mg by mouth every morning.   amphetamine-dextroamphetamine 30 MG tablet Commonly known as: ADDERALL Take 30 mg by mouth PC lunch.   escitalopram 10 MG tablet Commonly known as: LEXAPRO Take 10 mg by mouth daily.   lansoprazole 30 MG capsule Commonly known as: PREVACID Take 30 mg by mouth daily.   Tecfidera 120 & 240 MG Misc Generic drug: Dimethyl Fumarate Dispense 14 120 mg pills and 46 240 mg  pills.     120 mg po bid after meals x 7 days then 240 mg po bid   Dimethyl Fumarate 240 MG Cpdr Commonly known as: Tecfidera One po bid after meals   traZODone 50 MG tablet Commonly known as: DESYREL Take 1 tablet (50 mg total) by mouth at bedtime.        Allergies: No Known Allergies  Family History: Family History  Problem Relation Age of Onset   COPD Mother    Cancer Mother    Anxiety disorder Mother    ADD / ADHD Mother    Heart disease Father    Depression Sister    Anxiety disorder Sister    ADD / ADHD Sister     Social History:  reports that he has quit smoking. His smoking use included cigarettes. He has never used smokeless tobacco. He reports that he does not drink alcohol and does not use drugs.   Physical Exam: BP 120/76   Pulse 71   Ht 6' (1.829 m)   Wt 210 lb (95.3 kg)   BMI 28.48 kg/m   Constitutional:  Alert and oriented, No acute distress. HEENT: [place] AT, moist mucus membranes.  Trachea midline, no masses. Cardiovascular: No clubbing, cyanosis, or edema. Respiratory: Normal respiratory effort, no increased work of breathing. Psychiatric: Normal mood and affect.  Laboratory Data:  Urinalysis Pending   Assessment & Plan:  1. Urinary frequency 41 y.o. 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  Tentative 1 month follow-up scheduled for reassessment of symptoms.  If he leaves the area urology follow-up recommended.   Riki Altes, MD  [facility] [address] [place], [place] [address] [phone]

## 2021-09-23 ENCOUNTER — Encounter: Payer: Self-pay | Admitting: Urology

## 2021-09-24 ENCOUNTER — Telehealth: Payer: Self-pay | Admitting: Neurology

## 2021-09-24 ENCOUNTER — Telehealth: Payer: Self-pay | Admitting: *Deleted

## 2021-09-24 DIAGNOSIS — R350 Frequency of micturition: Secondary | ICD-10-CM

## 2021-09-24 NOTE — Telephone Encounter (Signed)
At 11:54 pt left vm stating the 1st day or 2 that he was on Dimethyl Fumarate (TECFIDERA) 120 & 240 MG MISC, he felt fine.  Pt stated after that he has had double vision and has concerns re: liver damage based on what he read up on re: this medication, pt would like to discuss going on another medication.

## 2021-09-24 NOTE — Telephone Encounter (Signed)
-----   Message from Riki Altes, MD sent at 09/23/2021  9:16 PM EDT ----- UA with >30 WBC. Will need to bring in repeat urine for culture

## 2021-09-24 NOTE — Telephone Encounter (Signed)
Notified patient as instructed, patient pleased. Discussed follow-up appointments, patient agrees  

## 2021-09-24 NOTE — Telephone Encounter (Signed)
I spoke with Dr. Epimenio Foot.  Patient was complaining of double vision at his appointment.  Dr. Epimenio Foot would like patient to continue with the dimethyl fumarate for at least a few weeks.  Dr. Epimenio Foot will check his liver enzymes and white blood cell count at his follow-up as scheduled in October.  I called patient to discuss.  No answer, left a voicemail asking him to call me back.

## 2021-09-25 NOTE — Telephone Encounter (Signed)
Pt read mychart message from 09/24/21 that was sent re this issue.

## 2021-09-27 ENCOUNTER — Ambulatory Visit: Payer: 59 | Admitting: Family Medicine

## 2021-10-01 ENCOUNTER — Other Ambulatory Visit: Payer: Self-pay | Admitting: *Deleted

## 2021-10-01 MED ORDER — AMITRIPTYLINE HCL 25 MG PO TABS: 25.0000 mg | ORAL_TABLET | Freq: Every day | ORAL | 5 refills | Status: DC

## 2021-10-04 ENCOUNTER — Other Ambulatory Visit: Payer: 59

## 2021-10-04 ENCOUNTER — Encounter: Payer: Self-pay | Admitting: Urology

## 2021-10-04 DIAGNOSIS — R350 Frequency of micturition: Secondary | ICD-10-CM

## 2021-10-04 LAB — URINALYSIS, COMPLETE
Bilirubin, UA: NEGATIVE
Glucose, UA: NEGATIVE
Ketones, UA: NEGATIVE
Leukocytes,UA: NEGATIVE
Nitrite, UA: NEGATIVE
RBC, UA: NEGATIVE
Specific Gravity, UA: 1.03 (ref 1.005–1.030)
Urobilinogen, Ur: 1 mg/dL (ref 0.2–1.0)
pH, UA: 5.5 (ref 5.0–7.5)

## 2021-10-04 LAB — MICROSCOPIC EXAMINATION

## 2021-10-05 ENCOUNTER — Other Ambulatory Visit: Payer: Self-pay | Admitting: Family Medicine

## 2021-10-05 MED ORDER — MIRABEGRON ER 50 MG PO TB24: 50.0000 mg | ORAL_TABLET | Freq: Every day | ORAL | 1 refills | Status: AC

## 2021-10-08 ENCOUNTER — Telehealth: Payer: Self-pay

## 2021-10-08 ENCOUNTER — Telehealth: Payer: Self-pay | Admitting: Neurology

## 2021-10-08 ENCOUNTER — Encounter: Payer: Self-pay | Admitting: Family Medicine

## 2021-10-08 LAB — CULTURE, URINE COMPREHENSIVE

## 2021-10-08 NOTE — Telephone Encounter (Signed)
-----   Message from Riki Altes, MD sent at 10/08/2021  9:48 AM EDT ----- Repeat urinalysis was normal and there was no evidence of infection

## 2021-10-08 NOTE — Telephone Encounter (Signed)
Mychart notification sent 

## 2021-10-08 NOTE — Telephone Encounter (Signed)
Called pt to r/s 10/5 appt (Dr. Epimenio Foot out) and he requested to come in sooner due to issues with his medication. Scheduled pt for 10/24/21 at 2:30 pm and added to the wait list.

## 2021-10-09 ENCOUNTER — Other Ambulatory Visit: Payer: Self-pay | Admitting: *Deleted

## 2021-10-11 ENCOUNTER — Encounter: Payer: Self-pay | Admitting: Neurology

## 2021-10-11 ENCOUNTER — Ambulatory Visit: Payer: 59 | Admitting: Neurology

## 2021-10-11 VITALS — BP 151/96 | HR 61 | Ht 72.0 in | Wt 202.8 lb

## 2021-10-11 DIAGNOSIS — R269 Unspecified abnormalities of gait and mobility: Secondary | ICD-10-CM | POA: Diagnosis not present

## 2021-10-11 DIAGNOSIS — F909 Attention-deficit hyperactivity disorder, unspecified type: Secondary | ICD-10-CM | POA: Diagnosis not present

## 2021-10-11 DIAGNOSIS — G35 Multiple sclerosis: Secondary | ICD-10-CM

## 2021-10-11 DIAGNOSIS — Z79899 Other long term (current) drug therapy: Secondary | ICD-10-CM

## 2021-10-11 MED ORDER — RAMELTEON 8 MG PO TABS: 8.0000 mg | ORAL_TABLET | Freq: Every day | ORAL | 2 refills | Status: AC

## 2021-10-12 ENCOUNTER — Telehealth: Payer: Self-pay | Admitting: Neurology

## 2021-10-12 ENCOUNTER — Ambulatory Visit (HOSPITAL_BASED_OUTPATIENT_CLINIC_OR_DEPARTMENT_OTHER): Admission: RE | Admit: 2021-10-12 | Payer: 59 | Source: Ambulatory Visit

## 2021-10-15 LAB — CBC WITH DIFFERENTIAL/PLATELET
Basophils Absolute: 0.1 10*3/uL (ref 0.0–0.2)
Basos: 0 %
EOS (ABSOLUTE): 0.5 10*3/uL — ABNORMAL HIGH (ref 0.0–0.4)
Eos: 4 %
Hematocrit: 41.6 % (ref 37.5–51.0)
Hemoglobin: 14.1 g/dL (ref 13.0–17.7)
Immature Grans (Abs): 0 10*3/uL (ref 0.0–0.1)
Immature Granulocytes: 0 %
Lymphocytes Absolute: 2 10*3/uL (ref 0.7–3.1)
Lymphs: 17 %
MCH: 29.9 pg (ref 26.6–33.0)
MCHC: 33.9 g/dL (ref 31.5–35.7)
MCV: 88 fL (ref 79–97)
Monocytes Absolute: 0.8 10*3/uL (ref 0.1–0.9)
Monocytes: 7 %
Neutrophils Absolute: 8.4 10*3/uL — ABNORMAL HIGH (ref 1.4–7.0)
Neutrophils: 72 %
Platelets: 313 10*3/uL (ref 150–450)
RBC: 4.71 x10E6/uL (ref 4.14–5.80)
RDW: 13.5 % (ref 11.6–15.4)
WBC: 11.8 10*3/uL — ABNORMAL HIGH (ref 3.4–10.8)

## 2021-10-15 LAB — HEPATITIS B SURFACE ANTIGEN: Hepatitis B Surface Ag: NEGATIVE

## 2021-10-15 LAB — IGG, IGA, IGM
IgA/Immunoglobulin A, Serum: 250 mg/dL (ref 90–386)
IgG (Immunoglobin G), Serum: 908 mg/dL (ref 603–1613)
IgM (Immunoglobulin M), Srm: 143 mg/dL (ref 20–172)

## 2021-10-15 LAB — QUANTIFERON-TB GOLD PLUS
QuantiFERON Mitogen Value: >10 IU/mL
QuantiFERON Nil Value: 0 IU/mL
QuantiFERON TB1 Ag Value: 0.02 IU/mL
QuantiFERON TB2 Ag Value: 0.04 IU/mL
QuantiFERON-TB Gold Plus: NEGATIVE

## 2021-10-15 LAB — HEPATITIS B SURFACE ANTIBODY,QUALITATIVE: Hep B Surface Ab, Qual: NONREACTIVE

## 2021-10-15 LAB — HEPATITIS B CORE ANTIBODY, TOTAL: Hep B Core Total Ab: NEGATIVE

## 2021-10-15 LAB — VARICELLA ZOSTER ANTIBODY, IGG: Varicella zoster IgG: 964 index (ref >165)

## 2021-10-15 NOTE — Telephone Encounter (Signed)
Called the patient back and he found his script for the cane and doesn't need another one.  He wanted to know about the new medication and where we are. I advised that Dr Epimenio Foot reviewed the lab results and states he is ok to start. Our MS nurse has the form and will get that process going for him. He wanted to make sure we know that his insurance requires he uses [employer]. Informed I would note that on our end. Advised we would update once we know about insurance coverage for the medication. Pt verbalized understanding.

## 2021-10-16 NOTE — Telephone Encounter (Signed)
Pt was advised that we were working on starting the process and informed the patient that we would be in touch with him as soon as we gotten [place]. **If patient returns call please advise that we are waiting on insurance authorization.

## 2021-10-17 NOTE — Telephone Encounter (Signed)
Additional information requested received from [facility]. This information requires MD signature at the bottom of the page. I have completed and placed in MD's box for signature.

## 2021-10-17 NOTE — Telephone Encounter (Signed)
MD has completed additional information and it has been faxed to [phone]. Confirmation received

## 2021-10-18 ENCOUNTER — Other Ambulatory Visit: Payer: Self-pay | Admitting: Neurology

## 2021-10-18 NOTE — Telephone Encounter (Signed)
Received fax from [facility] that PA approved 10/15/21-10/16/22.

## 2021-10-22 ENCOUNTER — Telehealth: Payer: Self-pay | Admitting: Neurology

## 2021-10-24 ENCOUNTER — Ambulatory Visit: Payer: 59 | Admitting: Neurology

## 2021-10-24 ENCOUNTER — Telehealth: Payer: Self-pay | Admitting: Neurology

## 2021-10-24 NOTE — Telephone Encounter (Signed)
Karen(pharmacist) @ [facility] states pt has informed them that he is no longer on Dimethyl Fumarate (TECFIDERA) 240 MG CPDR, but is getting a prescription from Dr Epimenio Foot for [employer].  Please call [phone] and ask for a pharmacist

## 2021-10-24 NOTE — Telephone Encounter (Signed)
Called back and spoke w/ Angelica Chessman Physiological scientist). Transferred me to pharmacist Gay Filler). Called and confirmed dimethyl fumarate d/c'd and therapy changed to Kesimpta instead. He verbalized understanding.  Nothing further needed.

## 2021-10-29 ENCOUNTER — Ambulatory Visit: Payer: 59 | Admitting: Urology

## 2021-11-15 ENCOUNTER — Telehealth: Payer: Self-pay | Admitting: *Deleted

## 2021-11-15 NOTE — Telephone Encounter (Signed)
I called pt not able to reach pt. I will mail copy of pt last office note. Thanks

## 2022-01-02 ENCOUNTER — Other Ambulatory Visit: Payer: 59 | Admitting: *Deleted

## 2022-01-24 ENCOUNTER — Ambulatory Visit: Payer: 59 | Admitting: Neurology

## 2022-02-07 ENCOUNTER — Ambulatory Visit: Payer: Self-pay | Admitting: Neurology
